# Patient Record
Sex: Female | Born: 1950 | Race: White | Hispanic: No | Marital: Married | Smoking: Never smoker
Health system: Southern US, Community
[De-identification: ages and names within clinical notes are randomized; demographics above are authoritative.]

## PROBLEM LIST (undated history)

## (undated) DIAGNOSIS — E785 Hyperlipidemia, unspecified: Secondary | ICD-10-CM

## (undated) DIAGNOSIS — E1142 Type 2 diabetes mellitus with diabetic polyneuropathy: Secondary | ICD-10-CM

## (undated) DIAGNOSIS — E119 Type 2 diabetes mellitus without complications: Secondary | ICD-10-CM

## (undated) DIAGNOSIS — I10 Essential (primary) hypertension: Secondary | ICD-10-CM

## (undated) HISTORY — PX: SKIN CANCER EXCISION: SHX779

## (undated) HISTORY — DX: Essential (primary) hypertension: I10

## (undated) HISTORY — PX: UTERINE FIBROID SURGERY: SHX826

## (undated) HISTORY — PX: RENAL CYST EXCISION: SUR506

## (undated) HISTORY — PX: LIPOMA EXCISION: SHX5283

## (undated) HISTORY — DX: Type 2 diabetes mellitus without complications: E11.9

## (undated) HISTORY — DX: Hyperlipidemia, unspecified: E78.5

---

## 1999-07-11 ENCOUNTER — Other Ambulatory Visit: Admission: RE | Admit: 1999-07-11 | Discharge: 1999-07-11 | Payer: Self-pay | Admitting: Obstetrics and Gynecology

## 2001-04-29 ENCOUNTER — Other Ambulatory Visit: Admission: RE | Admit: 2001-04-29 | Discharge: 2001-04-29 | Payer: Self-pay | Admitting: Family Medicine

## 2001-08-04 HISTORY — PX: NEPHRECTOMY: SHX65

## 2004-01-19 ENCOUNTER — Ambulatory Visit: Payer: Self-pay | Admitting: Urology

## 2008-02-17 ENCOUNTER — Ambulatory Visit: Payer: Self-pay | Admitting: Urology

## 2012-10-22 DIAGNOSIS — C44321 Squamous cell carcinoma of skin of nose: Secondary | ICD-10-CM | POA: Insufficient documentation

## 2013-01-04 HISTORY — PX: HERNIA REPAIR: SHX51

## 2013-01-10 ENCOUNTER — Inpatient Hospital Stay: Payer: Self-pay | Admitting: Surgery

## 2013-01-10 LAB — COMPREHENSIVE METABOLIC PANEL
Albumin: 3.6 g/dL (ref 3.4–5.0)
Alkaline Phosphatase: 46 U/L
Anion Gap: 12 (ref 7–16)
BUN: 20 mg/dL — ABNORMAL HIGH (ref 7–18)
Bilirubin,Total: 0.6 mg/dL (ref 0.2–1.0)
Calcium, Total: 9.2 mg/dL (ref 8.5–10.1)
Chloride: 97 mmol/L — ABNORMAL LOW (ref 98–107)
Co2: 27 mmol/L (ref 21–32)
Creatinine: 0.89 mg/dL (ref 0.60–1.30)
EGFR (African American): >60
EGFR (Non-African Amer.): >60
Glucose: 143 mg/dL — ABNORMAL HIGH (ref 65–99)
Potassium: 3 mmol/L — ABNORMAL LOW (ref 3.5–5.1)
Sodium: 136 mmol/L (ref 136–145)

## 2013-01-10 LAB — URINALYSIS, COMPLETE
Glucose,UR: NEGATIVE mg/dL (ref 0–75)
RBC,UR: 10 /HPF (ref 0–5)
Squamous Epithelial: 9

## 2013-01-10 LAB — CBC
MCH: 25.4 pg — ABNORMAL LOW (ref 26.0–34.0)
MCHC: 33.8 g/dL (ref 32.0–36.0)
Platelet: 273 10*3/uL (ref 150–440)
RBC: 5.39 10*6/uL — ABNORMAL HIGH (ref 3.80–5.20)
RDW: 15.2 % — ABNORMAL HIGH (ref 11.5–14.5)
WBC: 9.4 10*3/uL (ref 3.6–11.0)

## 2013-01-10 LAB — LIPASE, BLOOD: Lipase: 167 U/L (ref 73–393)

## 2013-01-11 LAB — BASIC METABOLIC PANEL
Anion Gap: 6 — ABNORMAL LOW (ref 7–16)
Co2: 31 mmol/L (ref 21–32)
Creatinine: 0.92 mg/dL (ref 0.60–1.30)
Glucose: 161 mg/dL — ABNORMAL HIGH (ref 65–99)
Osmolality: 274 (ref 275–301)

## 2013-01-11 LAB — CBC WITH DIFFERENTIAL/PLATELET
Eosinophil #: 0.1 10*3/uL (ref 0.0–0.7)
Eosinophil %: 0.7 %
HGB: 12 g/dL (ref 12.0–16.0)
Lymphocyte #: 1.6 10*3/uL (ref 1.0–3.6)
Lymphocyte %: 16.2 %
MCHC: 33.3 g/dL (ref 32.0–36.0)
Monocyte %: 8 %
Neutrophil #: 7.2 10*3/uL — ABNORMAL HIGH (ref 1.4–6.5)
RBC: 4.78 10*6/uL (ref 3.80–5.20)

## 2013-01-12 LAB — PATHOLOGY REPORT

## 2013-07-14 LAB — LIPID PANEL
Cholesterol: 180 mg/dL (ref 0–200)
HDL: 52 mg/dL (ref 35–70)
LDL Cholesterol: 99 mg/dL
Triglycerides: 147 mg/dL (ref 40–160)

## 2013-07-14 LAB — TSH: TSH: 2.45 u[IU]/mL (ref <=5.90)

## 2014-02-16 ENCOUNTER — Ambulatory Visit: Payer: Self-pay | Admitting: Family Medicine

## 2014-02-16 LAB — HM DEXA SCAN

## 2014-03-09 LAB — HEMOGLOBIN A1C: HEMOGLOBIN A1C: 6.7 % — AB (ref 4.0–6.0)

## 2014-03-09 LAB — BASIC METABOLIC PANEL
BUN: 20 mg/dL (ref 4–21)
Creatinine: 1 mg/dL (ref <=1.1)
Glucose: 139 mg/dL
POTASSIUM: 4.6 mmol/L (ref 3.4–5.3)
SODIUM: 142 mmol/L (ref 137–147)

## 2014-03-09 LAB — CBC AND DIFFERENTIAL
HEMATOCRIT: 40 % (ref 36–46)
Hemoglobin: 13.1 g/dL (ref 12.0–16.0)
Platelets: 248 10*3/uL (ref 150–399)
WBC: 5.6 10^3/mL

## 2014-03-09 LAB — HEPATIC FUNCTION PANEL
ALT: 25 U/L (ref 7–35)
AST: 20 U/L (ref 13–35)

## 2014-03-16 ENCOUNTER — Ambulatory Visit: Payer: Self-pay | Admitting: Family Medicine

## 2014-05-27 NOTE — Op Note (Signed)
PATIENT NAME:  Karen Olsen, Karen Olsen MR#:  [id number] DATE OF BIRTH:  [birth year]  DATE OF PROCEDURE:  01/10/2013  PREOPERATIVE DIAGNOSIS: Acutely incarcerated ventral hernia.   POSTOPERATIVE DIAGNOSIS: Acutely incarcerated ventral hernia.   PROCEDURE: Repair of acutely incarcerated recurrent ventral hernia with mesh.   SURGEON: Phoebe Perch, M.D.   ANESTHESIA: General with endotracheal tube.   INDICATIONS: This is Olsen patient who we believe has had Olsen prior umbilical hernia repair and multiple operations through Olsen midline incision, who presents with Olsen likely recurrent periumbilical ventral hernia with incarcerated small bowel and small bowel obstruction. Preoperatively, we discussed rationale for surgery, the options of observation, risks of bleeding, infection, recurrence, mesh placement, mesh infection, mesh removal and bowel injury. This was all reviewed for her in the preop holding area. She understood and agreed to proceed, as did her husband.   FINDINGS: Noncompromised but acutely incarcerated small bowel loop through Olsen very tight small recurrent ventral hernia with long-standing sac.   DESCRIPTION OF PROCEDURE: The patient was induced to general anesthesia. She was given IV antibiotics. VTE prophylaxis was in place. Olsen Foley catheter was in place. Then she was prepped and draped in sterile fashion. Marcaine was infiltrated in the skin and subcutaneous tissues around the periumbilical area and Olsen midline incision was utilized to open and explore Olsen large incarcerated hernia sac on the midline periumbilical. In so doing, clear fluid exuded. The small bowel was found to be acutely incarcerated and could not be reduced. The incision was extended cephalad to allow for extension of the hernia rent to allow for reduction. The bowel was inspected after reduction and it was showing no signs of ischemia, however, there was Olsen small serosal tear which was closed with Lembert-type imbricating 3-0 silk sutures. No  sign of luminal rent was noticed. Again, the bowel was placed back into the abdominal cavity and additional Marcaine was placed for Olsen total of 30 mL. The hernia sac was dissected off of the subcutaneous tissues and trimmed, and Olsen portion of it was sent for examination labeled as hernia sac.   The bowel was then eviscerated again and no sign of ischemia was noted. The serosal tear had been adequately repaired and at this point the bowel was placed back into the abdominal cavity.  The rent was measured and then Olsen 6.4 cm Ventralex patch with PTFE was placed into the abdominal cavity. The extended portion of the hernia rent cephalad was closed with figure-of-eight 0 Prolene incorporating the cephalad extent of the mesh and then multiple U sutures or figure-of-eight sutures of 0 Prolene were placed as well to hold the mesh in place. Adequate repair was noted. The hernia sac was closed over the top of the mesh after the tails were closed, this being done with first 0 Prolenes followed by 2-0 and 3-0 Vicryl in layers, also approximating the skin of the umbilicus back to the fascia. Once multiple layers of Vicryl were utilized to close the dead space in the periumbilical area, skin staples were placed and Olsen sterile dressing was placed. The patient tolerated the procedure well. There were no complications. Sponge, lap and needle counts were correct. The estimated blood loss was 25 mL.   ____________________________ Jerrol Banana. Burt Knack, MD rec:cs D: 01/10/2013 12:27:00 ET T: 01/10/2013 19:41:33 ET JOB#: [id number]  cc: Jerrol Banana. Burt Knack, MD, <Dictator> Florene Glen MD ELECTRONICALLY SIGNED 01/14/2013 12:39

## 2014-05-27 NOTE — H&P (Signed)
Subjective/Chief Complaint abd pain   History of Present Illness three days abd pain, nausea vomiting, cramping. 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:   ALLERGIES:  Ampicillin: Rash  Ceclor: Rash  Seconal: Other  Penicillin: Rash  Family and Social History:  Family History Non-Contributory   Social History negative tobacco, negative ETOH   Place of Living Home   Review of Systems:  Fever/Chills No   Cough No   Abdominal Pain Yes   Diarrhea No   Constipation No   Nausea/Vomiting Yes   SOB/DOE No   Chest Pain No   Dysuria No   Tolerating Diet No  Nauseated  Vomiting   Physical Exam:  GEN obese, very uncomfortable   HEENT pink conjunctivae   NECK supple   RESP normal resp effort  clear BS  no use of accessory muscles   CARD regular rate   ABD positive tenderness  positive hernia  soft  distended  scars, inc recurrent VH in periumbilical area   LYMPH negative neck   EXTR negative edema   SKIN normal to palpation   PSYCH alert, A+O to time, place, person, good insight   Lab Results: Hepatic:  07-Dec-14 07:33   Bilirubin, Total 0.6  Alkaline Phosphatase 46 (45-117 NOTE: New Reference Range 12/25/12)  SGPT (ALT) 20  SGOT (AST)  12  Total Protein, Serum 7.0  Albumin, Serum 3.6  Routine Chem:  07-Dec-14 07:33   Lipase 167 (Result(s) reported on 10 Jan 2013 at 08:13AM.)  Glucose, Serum  143  BUN  20  Creatinine (comp) 0.89  Sodium, Serum 136  Potassium, Serum  3.0  Chloride, Serum  97  CO2, Serum 27  Calcium (Total), Serum 9.2   Osmolality (calc) 277  eGFR (African American) >60  eGFR (Non-African American) >60 (eGFR values <70m/min/1.73 m2 may be an indication of chronic kidney disease (CKD). Calculated eGFR is useful in patients with stable renal function. The eGFR calculation will not be reliable in acutely ill patients when serum creatinine is changing rapidly. It is not useful in  patients on dialysis. The eGFR calculation may not be applicable to patients at the low and high extremes of body sizes, pregnant women, and vegetarians.)  Anion Gap 12  Routine UA:  07-Dec-14 07:33   Color (UA) Yellow  Clarity (UA) Turbid  Glucose (UA) Negative  Bilirubin (UA) Negative  Ketones (UA) 1+  Specific Gravity (UA) 1.030  Blood (UA) 2+  pH (UA) 5.0  Protein (UA) 30 mg/dL  Nitrite (UA) Negative  Leukocyte Esterase (UA) Trace (Result(s) reported on 10 Jan 2013 at 08:20AM.)  RBC (UA) 10 /HPF  WBC (UA) 24 /HPF  Bacteria (UA) TRACE  Epithelial Cells (UA) 9 /HPF (Result(s) reported on 10 Jan 2013 at 08:20AM.)  Routine Hem:  07-Dec-14 07:33   WBC (CBC) 9.4  RBC (CBC)  5.39  Hemoglobin (CBC) 13.7  Hematocrit (CBC) 40.6  Platelet Count (CBC) 273 (Result(s) reported on 10 Jan 2013 at 08:08AM.)  MCV  75  MCH  25.4  MCHC 33.8  RDW  15.2  Radiology Results: CT:    07-Dec-14 08:53, CT Abdomen and Pelvis With Contrast  CT Abdomen and Pelvis With Contrast  REASON FOR EXAM:    (1) lower abd cramping intermittently x 2 weeks,   worse x 2days, umbilical hernia  COMMENTS:       PROCEDURE: CT  - CT ABDOMEN / PELVIS  W  - Jan 10 2013  8:53AM     CLINICAL DATA:  Abdominal cramping.  Nausea.    EXAM:  CT ABDOMEN AND PELVIS WITH CONTRAST    TECHNIQUE:  Multidetector CT imaging of the abdomen and pelvis was performed  using the standard protocol following bolus administration of  intravenous contrast.  CONTRAST:  125 cc of Isovue 370    COMPARISON:  None    FINDINGS:  The lung bases appear clear. No pleural  or pericardial effusion  identified.    The stomach stress. No focal liver abnormalities. Gallbladder is  normal. There is no biliary dilatation. The pancreas is within  normal limits. Normal appearance of the spleen. The right adrenal  gland is normal. Normal appearance of the left adrenal gland.  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 Previous left  nephrectomy.  The urinary bladder appears normal. The uterus appears abnormally  enlarged for a postmenopausal female. There is mixed attenuation  within the distended endometrial cavity, image number 69/series 2.  There is a mass within the left-sided uterus which measures 5.8 cm,  image 70/ series 2.    Normal caliber of the abdominal aorta. There is mild calcified  atherosclerotic change. No upper abdominal adenopathy identified.  There is no pelvic or inguinal adenopathy noted.    The stomach appears within normal limits. The small bowel loops are  abnormally dilated. Thesemeasure up to 3.7 cm and there are small  bowel air-fluid levels. Umbilical hernia is identified which  contains the obstructed loop of small bowel, image 54/series 2. The  appendix is visualized and appears normal. Normal appearance of the  colon. Asmall amount of free fluid is present within the pelvis.    Review of the visualized bony structures is significant for mild  lumbar spondylosis. No aggressive lytic or sclerotic bone lesions  identified.     IMPRESSION:  1. Small bowel obstruction. The point of obstruction is the small  bowel containing umbilical hernia.  2. Free fluid within the pelvis is likely secondary to obstruction.  3. Abnormal appearance of the uterus for a postmenopausal female.  The uterine cavity is distended in contains mixed attenuation  material. Cannot rule out endometrial neoplasm. Clinical correlation  and pelvic sonogram is advised.  Electronically Signed    By: Kerby Moors M.D.    On: 01/10/2013 09:04          Verified By: Angelita Ingles, M.D.,    Assessment/Admission Diagnosis acutely incarcerated rec VH with bowel present. No sign of ischemia but needs urgent exploration options rationale and risks rev'd see dictation   Electronic Signatures: Florene Glen (MD)  (Signed 07-Dec-14 10:37)  Authored: CHIEF COMPLAINT and HISTORY, PAST MEDICAL/SURGIAL HISTORY, ALLERGIES, FAMILY AND SOCIAL HISTORY, REVIEW OF SYSTEMS, PHYSICAL EXAM, LABS, Radiology, ASSESSMENT AND PLAN   Last Updated: 07-Dec-14 10:37 by Florene Glen (MD)

## 2014-05-27 NOTE — H&P (Signed)
PATIENT NAME:  Karen Olsen, Karen Olsen MR#:  [id number] DATE OF BIRTH:  07-Jan-1951  DATE OF ADMISSION:  01/10/2013  CHIEF COMPLAINT: Abdominal pain.   HISTORY OF PRESENT ILLNESS: This is Olsen patient with 3 days of abdominal pain that started Thursday night. She had had an episode like this before back in November that resolved, but this one has been unrelenting. She has cramping abdominal pain and points to her periumbilical area where there is an obvious ventral hernia present which is incarcerated. She describes nausea, multiple emeses, although she has not had an emesis since yesterday and is passing gas. She has not had Olsen bowel movement since yesterday and her pain has not improved. Denies fevers or chills.   PAST MEDICAL HISTORY: Diabetes, hypertension. Denies coronary artery disease, myocardial infarction or stroke or lung disease.   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   ALLERGIES: MULTIPLE INCLUDING AMPICILLIN, CECLOR, PENICILLIN AND SECONAL.   MEDICATIONS: Multiple, see chart.   FAMILY HISTORY: Noncontributory.   SOCIAL HISTORY: The patient does not smoke or drink.   REVIEW OF SYSTEMS: Olsen 10 system review has been performed and negative with the exception of that mentioned in the history of present illness.   PHYSICAL EXAMINATION: GENERAL: Very uncomfortable appearing female patient, somewhat writhing on the ER bed, holding her abdomen.  VITAL SIGNS: Temperature of 97.9, pulse 77, respirations 20, blood pressure 151/89, 98% room air sat, marked as Olsen pain scale of 2.  HEENT: No scleral icterus.  NECK: No palpable neck nodes.  CHEST: Clear to auscultation.  CARDIAC: Regular rate and rhythm.  ABDOMEN: Distended, tympanitic. There is an infraumbilical midline scar, somewhat complex in nature, with an incarcerated periumbilical, possibly recurrent ventral hernia. It is very  tender, but there is no overlying erythema, no skin erosions, but it is not reducible and is quite tender. No guarding or rebound present.  EXTREMITIES: Without edema. Calves are nontender.  NEUROLOGIC: Grossly intact.  INTEGUMENT: No jaundice.   CT scan is personally reviewed showing an incarcerated loop of small bowel within Olsen ventral hernia on the midline. Hemogram is within normal limits. Electrolytes show Olsen potassium slightly depressed at 3.0 and Olsen BUN of 20, otherwise normal.   ASSESSMENT AND PLAN: This is Olsen patient with an incarcerated periumbilical, recurrent ventral hernia. There is Olsen question of Olsen possible umbilical hernia repair in the past bringing up the question of recurrence. She has an acutely incarcerated piece of small bowel within this and requires urgent exploration. I discussed with her the plans for urgent exploration, the options of observation, and the risks of bleeding, infection, recurrence, mesh placement, mesh infection and recurrence and the need for removal of infected mesh, and also the potential for not placing mesh with its higher risk of recurrence due to the presence of compromised bowel. The potential for Olsen bowel resection was discussed with its inherent risks of bleeding, infection and anastomotic leak. This was reviewed for she and her husband. They understood and agreed to proceed with urgent exploration and repair of an incarcerated recurrent ventral hernia.    ____________________________ Jerrol Banana. Burt Knack, MD rec:aw D: 01/10/2013 10:42:06 ET T: 01/10/2013 11:25:19 ET JOB#: [id number]  cc: Jerrol Banana. Burt Knack, MD, <Dictator> Florene Glen MD ELECTRONICALLY SIGNED 01/10/2013 12:57

## 2014-06-21 DIAGNOSIS — I152 Hypertension secondary to endocrine disorders: Secondary | ICD-10-CM | POA: Insufficient documentation

## 2014-06-21 DIAGNOSIS — D219 Benign neoplasm of connective and other soft tissue, unspecified: Secondary | ICD-10-CM | POA: Insufficient documentation

## 2014-06-21 DIAGNOSIS — R319 Hematuria, unspecified: Secondary | ICD-10-CM | POA: Insufficient documentation

## 2014-06-21 DIAGNOSIS — I10 Essential (primary) hypertension: Secondary | ICD-10-CM | POA: Insufficient documentation

## 2014-06-21 DIAGNOSIS — D3000 Benign neoplasm of unspecified kidney: Secondary | ICD-10-CM | POA: Insufficient documentation

## 2014-06-21 DIAGNOSIS — E1159 Type 2 diabetes mellitus with other circulatory complications: Secondary | ICD-10-CM | POA: Insufficient documentation

## 2014-06-21 DIAGNOSIS — O24419 Gestational diabetes mellitus in pregnancy, unspecified control: Secondary | ICD-10-CM | POA: Insufficient documentation

## 2014-06-21 DIAGNOSIS — E559 Vitamin D deficiency, unspecified: Secondary | ICD-10-CM | POA: Insufficient documentation

## 2014-06-21 DIAGNOSIS — E119 Type 2 diabetes mellitus without complications: Secondary | ICD-10-CM | POA: Insufficient documentation

## 2014-06-21 DIAGNOSIS — D229 Melanocytic nevi, unspecified: Secondary | ICD-10-CM | POA: Insufficient documentation

## 2014-06-21 DIAGNOSIS — R202 Paresthesia of skin: Secondary | ICD-10-CM | POA: Insufficient documentation

## 2014-06-21 DIAGNOSIS — E785 Hyperlipidemia, unspecified: Secondary | ICD-10-CM

## 2014-06-21 DIAGNOSIS — E1169 Type 2 diabetes mellitus with other specified complication: Secondary | ICD-10-CM | POA: Insufficient documentation

## 2014-08-31 ENCOUNTER — Ambulatory Visit (INDEPENDENT_AMBULATORY_CARE_PROVIDER_SITE_OTHER): Payer: PPO | Admitting: Family Medicine

## 2014-08-31 ENCOUNTER — Encounter: Payer: Self-pay | Admitting: Family Medicine

## 2014-08-31 VITALS — BP 162/96 | HR 68 | Temp 98.6°F | Resp 16 | Ht 62.0 in | Wt 189.0 lb

## 2014-08-31 DIAGNOSIS — E119 Type 2 diabetes mellitus without complications: Secondary | ICD-10-CM | POA: Diagnosis not present

## 2014-08-31 DIAGNOSIS — I10 Essential (primary) hypertension: Secondary | ICD-10-CM | POA: Diagnosis not present

## 2014-08-31 DIAGNOSIS — E7800 Pure hypercholesterolemia, unspecified: Secondary | ICD-10-CM

## 2014-08-31 DIAGNOSIS — D1803 Hemangioma of intra-abdominal structures: Secondary | ICD-10-CM | POA: Diagnosis not present

## 2014-08-31 LAB — POCT GLYCOSYLATED HEMOGLOBIN (HGB A1C)
Est. average glucose Bld gHb Est-mCnc: 140
HEMOGLOBIN A1C: 6.5

## 2014-08-31 NOTE — Progress Notes (Signed)
Subjective:    Patient ID: Karen Olsen, female    DOB: [birth year], 64 y.o.   MRN: [id number]  Diabetes She presents for her follow-up diabetic visit. She has type 2 (Last A1C on 03/09/2014 was 6.7%) diabetes mellitus. Hypoglycemia symptoms include headaches and sweats. Associated symptoms include fatigue (due to hot weather) and foot paresthesias. Pertinent negatives for diabetes include no blurred vision, no chest pain, no foot ulcerations, no polydipsia, no polyphagia, no polyuria, no visual change, no weakness and no weight loss. Current diabetic treatment includes oral agent (monotherapy) (Metformin 1000 mg). She is compliant with treatment all of the time. She participates in exercise weekly (has recently decreased exercise due to heat). There is no change in her home blood glucose trend. She does not see a podiatrist.Eye exam is current.  Hypertension This is a chronic problem. Associated symptoms include headaches, malaise/fatigue (due to weather) and sweats. Pertinent negatives include no anxiety, blurred vision, chest pain, neck pain, orthopnea, palpitations, peripheral edema or shortness of breath. Treatments tried: Lisinopril-HCTZ 20-12.5 mg.  Hyperlipidemia This is a chronic problem. Lipid results: 07/14/2013 Total- 180; triglycerides- 147; HDL- 52; LDL- 99. Associated symptoms include a focal sensory loss (foot paresthesias) and leg pain (cramps at night). Pertinent negatives include no chest pain, focal weakness, myalgias or shortness of breath. Current antihyperlipidemic treatment includes statins (Atorvastatin 10 mg).      Review of Systems  Constitutional: Positive for malaise/fatigue (due to weather) and fatigue (due to hot weather). Negative for weight loss.  Eyes: Negative for blurred vision.  Respiratory: Negative for shortness of breath.   Cardiovascular: Negative for chest pain, palpitations and orthopnea.  Endocrine: Negative for polydipsia, polyphagia and polyuria.   Musculoskeletal: Negative for myalgias and neck pain.  Neurological: Positive for headaches. Negative for focal weakness and weakness.   BP 162/96 mmHg  Pulse 68  Temp(Src) 98.6 F (37 C) (Oral)  Resp 16  Ht 5\' 2"  (1.575 m)  Wt 189 lb (85.73 kg)  BMI 34.56 kg/m2   Patient Active Problem List   Diagnosis Date Noted  . Benign neoplasm of kidney 06/21/2014  . Diabetes mellitus, type 2 06/21/2014  . Essential (primary) hypertension 06/21/2014  . Diabetes mellitus arising in pregnancy 06/21/2014  . Blood in the urine 06/21/2014  . Hypercholesteremia 06/21/2014  . Burning or prickling sensation 06/21/2014  . Benign melanoma 06/21/2014  . Fibroid 06/21/2014  . Avitaminosis D 06/21/2014  . Squamous cell carcinoma of nose 10/22/2012   No past medical history on file. Current Outpatient Prescriptions on File Prior to Visit  Medication Sig  . ASPIRIN ADULT LOW STRENGTH PO Take by mouth.  Marland Kitchen atorvastatin (LIPITOR) 10 MG tablet Take by mouth.  . cyanocobalamin 100 MCG tablet Take by mouth.  Marland Kitchen glucose blood test strip RELION CONFIRM/MICRO TEST (In Vitro Strip)  1 (one) Strip Strip daily for 0 days  Quantity: 100;  Refills: 3   Ordered :29-July-2012  [name] ;  Started 29-July-2012 Active  . lisinopril-hydrochlorothiazide (PRINZIDE,ZESTORETIC) 20-12.5 MG per tablet Take by mouth.  . metFORMIN (GLUCOPHAGE) 1000 MG tablet Take by mouth.  . MULTIPLE VITAMIN PO Take by mouth.  . Omega-3 Fatty Acids (FISH OIL) 1000 MG CAPS Take by mouth.   No current facility-administered medications on file prior to visit.   Allergies  Allergen Reactions  . Phosphate Laxative  [Sodium Phosphate]     Phosphosoda- Bad stomach cramps  . Ceclor  [Cefaclor] Rash  . Penicillins Rash   Past Surgical History  Procedure Laterality Date  . Hernia repair  01/2013    Dr. Burt Knack  . 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 Renal cyst excision Right   . Uterine fibroid surgery    .  Skin cancer excision    . Lipoma excision      Removed from back.   History   Social History  . Marital Status: Married    Spouse Name: N/A  . Number of Children: N/A  . Years of Education: N/A   Occupational History  . Not on file.   Social History Main Topics  . Smoking status: Never Smoker   . Smokeless tobacco: Never Used  . Alcohol Use: No  . Drug Use: No  . Sexual Activity: Not on file   Other Topics Concern  . Not on file   Social History Narrative   Family History  Problem Relation Age of Onset  . Stroke Mother   . Prostate cancer Father   . Congestive Heart Failure Father   . Stroke Maternal Grandmother   . Stroke Maternal Grandfather   . Alzheimer's disease Maternal Grandfather   . Cancer Paternal Grandmother   . Cancer Paternal Grandfather        Objective:   Physical Exam  Constitutional: She is oriented to person, place, and time. She appears well-developed and well-nourished.  Neurological: She is alert and oriented to person, place, and time.  Psychiatric: She has a normal mood and affect. Her behavior is normal. Judgment and thought content normal.   BP 162/96 mmHg  Pulse 68  Temp(Src) 98.6 F (37 C) (Oral)  Resp 16  Ht 5\' 2"  (1.575 m)  Wt 189 lb (85.73 kg)  BMI 34.56 kg/m2      Assessment & Plan:  1. Essential (primary) hypertension Elevated today. Did not take medication today.   Also had salt last night. Will eat healthy, take medication and monitor.  Will increase medication if needed.   2. Type 2 diabetes mellitus without complication Improved. Continue to work on lifestyle.  Continue current medication. Recheck in 3 months.  - POCT glycosylated hemoglobin (Hb A1C) Results for orders placed or performed in visit on 08/31/14  POCT glycosylated hemoglobin (Hb A1C)  Result Value Ref Range   Hemoglobin A1C 6.5    Est. average glucose Bld gHb Est-mCnc 140    v 3. Hypercholesteremia Stable.   4. Hemangioma of liver Will repeat  ultrasound for stability.   - US Abdomen Limited RUQ; Future  Margarita Rana, MD

## 2014-09-07 ENCOUNTER — Other Ambulatory Visit: Payer: Self-pay | Admitting: Family Medicine

## 2014-09-07 DIAGNOSIS — E7800 Pure hypercholesterolemia, unspecified: Secondary | ICD-10-CM

## 2014-09-07 DIAGNOSIS — I10 Essential (primary) hypertension: Secondary | ICD-10-CM

## 2014-09-21 ENCOUNTER — Other Ambulatory Visit: Payer: Self-pay | Admitting: Family Medicine

## 2014-09-21 ENCOUNTER — Ambulatory Visit
Admission: RE | Admit: 2014-09-21 | Discharge: 2014-09-21 | Disposition: A | Discharge status: Home / Self Care | Payer: PPO | Source: Ambulatory Visit | Attending: Family Medicine | Admitting: Family Medicine

## 2014-09-21 DIAGNOSIS — D1803 Hemangioma of intra-abdominal structures: Secondary | ICD-10-CM | POA: Diagnosis not present

## 2014-09-21 DIAGNOSIS — E119 Type 2 diabetes mellitus without complications: Secondary | ICD-10-CM

## 2014-09-21 NOTE — Telephone Encounter (Signed)
08/31/14 A1c 6.5

## 2014-11-16 ENCOUNTER — Telehealth: Payer: Self-pay

## 2014-11-16 NOTE — Telephone Encounter (Signed)
Pt called because her BP has been elevated recently; SBP has been as high as the 180's, and DBP has been as high as the 100's. Pt reports she is having a very slight headache located bilaterally behind her ears. When asked to rate the H/A on a pain scale, pt reports it is a 0/10, "it's just a tightness that is there". Pt denies confusion, slurred speech, facial asymmetry. Pt is concerned because her mother died from a CVA. Advised pt to go to ED if any stroke or MI sx appear. Appointment scheduled for tomorrow. Asked pt to bring in BP cuff to ensure calibration. Med list reviewed with pt. Renaldo Fiddler, CMA

## 2014-11-17 ENCOUNTER — Ambulatory Visit (INDEPENDENT_AMBULATORY_CARE_PROVIDER_SITE_OTHER): Payer: PPO | Admitting: Family Medicine

## 2014-11-17 ENCOUNTER — Encounter: Payer: Self-pay | Admitting: Family Medicine

## 2014-11-17 VITALS — BP 184/94 | HR 68 | Temp 99.2°F | Resp 16 | Wt 189.0 lb

## 2014-11-17 DIAGNOSIS — G4489 Other headache syndrome: Secondary | ICD-10-CM

## 2014-11-17 DIAGNOSIS — R519 Headache, unspecified: Secondary | ICD-10-CM | POA: Insufficient documentation

## 2014-11-17 DIAGNOSIS — I10 Essential (primary) hypertension: Secondary | ICD-10-CM

## 2014-11-17 DIAGNOSIS — R51 Headache: Secondary | ICD-10-CM

## 2014-11-17 MED ORDER — AMLODIPINE BESYLATE 2.5 MG PO TABS: 2.5000 mg | ORAL_TABLET | Freq: Every day | ORAL | Status: DC

## 2014-11-17 NOTE — Progress Notes (Signed)
Patient ID: Karen Olsen, female   DOB: [birth year], 64 y.o.   MRN: [id number]          [name] Female    DOB: 08/18/1950   64 y.o.   MRN: [id number] Visit Date: 11/17/2014  Today's Provider: Margarita Rana, MD   Chief Complaint  Patient presents with  . Hypertension   Subjective:    Hypertension This is a chronic problem. The problem has been waxing and waning since onset. The problem is uncontrolled (Pt states her bp's have been running 150-180's/ 90/100's ). Associated symptoms include headaches. Pertinent negatives include no anxiety, blurred vision, chest pain, malaise/fatigue, neck pain, orthopnea, palpitations, peripheral edema, PND, shortness of breath or sweats. Risk factors for coronary artery disease include dyslipidemia and diabetes mellitus. Past treatments include ACE inhibitors and diuretics. There are no compliance problems.  There is no history of angina, kidney disease, CVA, heart failure, PVD or retinopathy.       Allergies  Allergen Reactions  . Phosphate Laxative  [Sodium Phosphate]     Phosphosoda- Bad stomach cramps  . Ceclor  [Cefaclor] Rash  . Penicillins Rash   Previous Medications   ASPIRIN ADULT LOW STRENGTH PO    Take by mouth.   ATORVASTATIN (LIPITOR) 10 MG TABLET    TAKE ONE TABLET EVERY EVENING   GLUCOSE BLOOD TEST STRIP    RELION CONFIRM/MICRO TEST (In Vitro Strip)  1 (one) Strip Strip daily for 0 days  Quantity: 100;  Refills: 3   Ordered :29-July-2012  [name] ;  Started 29-July-2012 Active   LISINOPRIL-HYDROCHLOROTHIAZIDE (PRINZIDE,ZESTORETIC) 20-12.5 MG PER TABLET    TAKE TWO TABLETS DAILY USUALLY IN THE MORNING   METFORMIN (GLUCOPHAGE) 1000 MG TABLET    TAKE ONE TABLET TWICE A DAY   MULTIPLE VITAMIN PO    Take by mouth.   OMEGA-3 FATTY ACIDS (FISH OIL) 1000 MG CAPS    Take by mouth.   VITAMIN B-12 (CYANOCOBALAMIN) 1000 MCG TABLET    Take 1,000 mcg by mouth daily.     Review of Systems  Constitutional: Negative.   Negative for malaise/fatigue.  Eyes: Negative for blurred vision.  Respiratory: Positive for cough (Secondary to allergies.). Negative for apnea, choking, chest tightness, shortness of breath and stridor.   Cardiovascular: Negative.  Negative for chest pain, palpitations, orthopnea and PND.  Gastrointestinal: Negative.   Musculoskeletal: Negative for neck pain.  Neurological: Positive for dizziness (Only happened twice.) and headaches. Negative for light-headedness.    Social History  Substance Use Topics  . Smoking status: Never Smoker   . Smokeless tobacco: Never Used  . Alcohol Use: No   Objective:   BP 184/94 mmHg  Pulse 68  Temp(Src) 99.2 F (37.3 C) (Oral)  Resp 16  Wt 189 lb (85.73 kg)  Physical Exam  Constitutional: She is oriented to person, place, and time. She appears well-developed and well-nourished.  Cardiovascular: Normal rate and regular rhythm.   Pulmonary/Chest: Effort normal and breath sounds normal.  Neurological: She is alert and oriented to person, place, and time. No cranial nerve deficit. Coordination normal.  Skin: Skin is warm.  Psychiatric: She has a normal mood and affect. Her behavior is normal. Judgment and thought content normal.      Assessment & Plan:      1. Essential (primary) hypertension Not well controlled.  Will add amlodipine and monitor at home. Call if not improving. Husband is having a lot of medical issues right now.  - amLODipine (  NORVASC) 2.5 MG tablet; Take 1 tablet (2.5 mg total) by mouth daily.  Dispense: 90 tablet; Refill: 3  2. Other headache syndrome New. Suspect related to blood pressure.  Will work up further if does not go down.  Exam normal today.       Margarita Rana, MD  [facility]

## 2014-11-21 ENCOUNTER — Telehealth: Payer: Self-pay | Admitting: Family Medicine

## 2014-11-21 NOTE — Telephone Encounter (Signed)
Pt states she was in on Thursday for high blood pressure.  Pt states her blood pressure reading over the weekend was 180/100.   Pt states she also has a headache.  Pt is  asking if she needs to double up on her medication?  CB#[phone]/MW

## 2014-11-21 NOTE — Telephone Encounter (Signed)
Pt advised as directed below.   Thanks,

## 2014-11-21 NOTE — Telephone Encounter (Signed)
Yes.  Increase to 5 mg and continue to monitor. Thanks.

## 2014-11-30 ENCOUNTER — Ambulatory Visit (INDEPENDENT_AMBULATORY_CARE_PROVIDER_SITE_OTHER): Payer: PPO | Admitting: Family Medicine

## 2014-11-30 ENCOUNTER — Encounter: Payer: Self-pay | Admitting: Family Medicine

## 2014-11-30 VITALS — BP 156/84 | HR 64 | Temp 98.3°F | Resp 12 | Wt 187.0 lb

## 2014-11-30 DIAGNOSIS — E7800 Pure hypercholesterolemia, unspecified: Secondary | ICD-10-CM | POA: Diagnosis not present

## 2014-11-30 DIAGNOSIS — E119 Type 2 diabetes mellitus without complications: Secondary | ICD-10-CM

## 2014-11-30 DIAGNOSIS — Z23 Encounter for immunization: Secondary | ICD-10-CM | POA: Diagnosis not present

## 2014-11-30 DIAGNOSIS — I10 Essential (primary) hypertension: Secondary | ICD-10-CM | POA: Diagnosis not present

## 2014-11-30 LAB — POCT GLYCOSYLATED HEMOGLOBIN (HGB A1C): Hemoglobin A1C: 6.6

## 2014-11-30 MED ORDER — AMLODIPINE BESYLATE 10 MG PO TABS: 10.0000 mg | ORAL_TABLET | Freq: Every day | ORAL | Status: DC

## 2014-11-30 NOTE — Progress Notes (Signed)
Patient ID: Karen Olsen, female   DOB: May 08, 1950, 64 y.o.   MRN: [id number]     Visit Date: 11/30/2014  Today's Provider: Margarita Rana, MD   Chief Complaint  Patient presents with  . Hypertension  . Diabetes   Subjective:   HPI    Diabetes Mellitus Type II, Follow-up:   Lab Results  Component Value Date   HGBA1C 6.6 11/30/2014   HGBA1C 6.5 08/31/2014   HGBA1C 6.7* 03/09/2014    Last seen for diabetes 3 months ago.  Management since then includes none. She reports good compliance with treatment. She is not having side effects.   Current symptoms include neuropathy. Home blood sugar records:  Fasting 150s-180s, some 130s-depending on what she is eating.  Episodes of hypoglycemia? no   Current Insulin Regimen: none Most Recent Eye Exam: December 2015.  Scheduled for follow up in 2016. Has a spot that they are following.  Weight trend: stable Prior visit with dietician: yes - years ago when she had gestational diabetes when she was pregnant. She tries to eat a good healthy meals, some greens but not a lot.  Pertinent Labs:    Component Value Date/Time   CHOL 180 07/14/2013   TRIG 147 07/14/2013   CREATININE 1.0 03/09/2014   CREATININE 0.92 01/11/2013 0425    Wt Readings from Last 3 Encounters:  11/30/14 187 lb (84.823 kg)  11/17/14 189 lb (85.73 kg)  08/31/14 189 lb (85.73 kg)      Hypertension, follow-up:  BP Readings from Last 3 Encounters:  11/30/14 156/84  11/17/14 184/94  08/31/14 162/96    She was last seen for hypertension 2 weeks ago.  BP at that visit was 184/94.  Management since that visit includes adding Amlodipine 2.5 mg but on October 17th she called back due to B/P was still high and she was advised to increase Amlodipine to taking 2 tablets daily. She has been checking her B/P and she states it seems to run higher in the afternoon and evenings, per her B/P machine average B/P around 140s/90s.  70s in the am at diastolic.   She  checks her B/P 5 to 6 times daily. She reports good compliance with treatment. She is not having side effects.   No swelling.   She is exercising.walking regularly.     Weight trend- Stable  Wt Readings from Last 3 Encounters:  11/30/14 187 lb (84.823 kg)  11/17/14 189 lb (85.73 kg)  08/31/14 189 lb (85.73 kg)           Allergies  Allergen Reactions  . Phosphate Laxative  [Sodium Phosphate]     Phosphosoda- Bad stomach cramps  . Ceclor  [Cefaclor] Rash  . Penicillins Rash   Previous Medications   ASPIRIN ADULT LOW STRENGTH PO    Take by mouth.   ATORVASTATIN (LIPITOR) 10 MG TABLET    TAKE ONE TABLET EVERY EVENING   GLUCOSE BLOOD TEST STRIP    RELION CONFIRM/MICRO TEST (In Vitro Strip)  1 (one) Strip Strip daily for 0 days  Quantity: 100;  Refills: 3   Ordered :29-July-2012  [name] ;  Started 29-July-2012 Active   LISINOPRIL-HYDROCHLOROTHIAZIDE (PRINZIDE,ZESTORETIC) 20-12.5 MG PER TABLET    TAKE TWO TABLETS DAILY USUALLY IN THE MORNING   METFORMIN (GLUCOPHAGE) 1000 MG TABLET    TAKE ONE TABLET TWICE A DAY   MULTIPLE VITAMIN PO    Take by mouth.   OMEGA-3 FATTY ACIDS (FISH OIL) 1000 MG CAPS  Take by mouth.   VITAMIN B-12 (CYANOCOBALAMIN) 1000 MCG TABLET    Take 1,000 mcg by mouth daily.    Review of Systems  Constitutional: Negative.   Respiratory: Negative.   Cardiovascular: Negative.   Musculoskeletal: Positive for myalgias (after walking sometimes her legs hurt), back pain and arthralgias. Negative for joint swelling and gait problem.  Neurological: Positive for numbness.    Social History  Substance Use Topics  . Smoking status: Never Smoker   . Smokeless tobacco: Never Used  . Alcohol Use: No   Objective:   BP 156/84 mmHg  Pulse 64  Temp(Src) 98.3 F (36.8 C)  Resp 12  Wt 187 lb (84.823 kg)  Physical Exam  Constitutional: She is oriented to person, place, and time. She appears well-developed and well-nourished.  Cardiovascular: Normal rate  and regular rhythm.   Pulmonary/Chest: Effort normal and breath sounds normal.  Neurological: She is alert and oriented to person, place, and time.  Psychiatric: She has a normal mood and affect. Her behavior is normal. Judgment and thought content normal.      Assessment & Plan:    1. Essential (primary) hypertension Improved, but not quite at goal. Will increase Amlodipine to 10 mg and recheck at follow up.  Will call with results Will follow.  Take only once or twice a day.   - amLODipine (NORVASC) 10 MG tablet; Take 1 tablet (10 mg total) by mouth daily.  Dispense: 90 tablet; Refill: 3  2. Type 2 diabetes mellitus without complication, unspecified long term insulin use status (HCC) Stable.  Will continue to work on lifestyle. Keep her medication as is.   Recheck in 3 months.   - POCT HgB A1C Results for orders placed or performed in visit on 11/30/14  POCT HgB A1C  Result Value Ref Range   Hemoglobin A1C 6.6     3. Need for influenza vaccination Given today.   - Flu vaccine HIGH DOSE PF (Fluzone High dose)  4. Hypercholesteremia Check labs including CMET and CBC. Will start taking cholesterol medication with evening medication.   - Lipid panel  Margarita Rana, MD  [facility]

## 2014-12-01 ENCOUNTER — Telehealth: Payer: Self-pay

## 2014-12-01 LAB — CBC WITH DIFFERENTIAL/PLATELET
BASOS ABS: 0 10*3/uL (ref 0.0–0.2)
Basos: 1 %
EOS (ABSOLUTE): 0.1 10*3/uL (ref 0.0–0.4)
Eos: 2 %
HEMOGLOBIN: 13.4 g/dL (ref 11.1–15.9)
Hematocrit: 41.1 % (ref 34.0–46.6)
IMMATURE GRANS (ABS): 0 10*3/uL (ref 0.0–0.1)
Immature Granulocytes: 0 %
LYMPHS ABS: 2.3 10*3/uL (ref 0.7–3.1)
Lymphs: 30 %
MCH: 24.8 pg — AB (ref 26.6–33.0)
MCHC: 32.6 g/dL (ref 31.5–35.7)
MCV: 76 fL — ABNORMAL LOW (ref 79–97)
MONOS ABS: 0.4 10*3/uL (ref 0.1–0.9)
Monocytes: 5 %
NEUTROS PCT: 62 %
Neutrophils Absolute: 4.7 10*3/uL (ref 1.4–7.0)
PLATELETS: 261 10*3/uL (ref 150–379)
RBC: 5.41 x10E6/uL — AB (ref 3.77–5.28)
RDW: 14.7 % (ref 12.3–15.4)
WBC: 7.5 10*3/uL (ref 3.4–10.8)

## 2014-12-01 LAB — LIPID PANEL
Chol/HDL Ratio: 3.4 ratio units (ref 0.0–4.4)
Cholesterol, Total: 161 mg/dL (ref 100–199)
HDL: 47 mg/dL (ref >39)
LDL CALC: 71 mg/dL (ref 0–99)
Triglycerides: 214 mg/dL — ABNORMAL HIGH (ref 0–149)
VLDL CHOLESTEROL CAL: 43 mg/dL — AB (ref 5–40)

## 2014-12-01 LAB — COMPREHENSIVE METABOLIC PANEL
ALBUMIN: 4.7 g/dL (ref 3.6–4.8)
ALT: 28 IU/L (ref 0–32)
AST: 20 IU/L (ref 0–40)
Albumin/Globulin Ratio: 2 (ref 1.1–2.5)
Alkaline Phosphatase: 50 IU/L (ref 39–117)
BILIRUBIN TOTAL: 0.4 mg/dL (ref 0.0–1.2)
BUN / CREAT RATIO: 16 (ref 11–26)
BUN: 15 mg/dL (ref 8–27)
CALCIUM: 10.2 mg/dL (ref 8.7–10.3)
CHLORIDE: 97 mmol/L (ref 97–106)
CO2: 26 mmol/L (ref 18–29)
CREATININE: 0.91 mg/dL (ref 0.57–1.00)
GFR calc non Af Amer: 66 mL/min/{1.73_m2} (ref >59)
GFR, EST AFRICAN AMERICAN: 76 mL/min/{1.73_m2} (ref >59)
GLUCOSE: 135 mg/dL — AB (ref 65–99)
Globulin, Total: 2.4 g/dL (ref 1.5–4.5)
Potassium: 4 mmol/L (ref 3.5–5.2)
Sodium: 142 mmol/L (ref 136–144)
TOTAL PROTEIN: 7.1 g/dL (ref 6.0–8.5)

## 2014-12-01 NOTE — Telephone Encounter (Signed)
-----   Message from Margarita Rana, MD sent at 12/01/2014  6:38 AM EDT ----- Labs stable. Please notify patient. Thanks.

## 2014-12-01 NOTE — Telephone Encounter (Signed)
Pt advised.   Thanks,   -Zuleika Gallus  

## 2015-02-22 ENCOUNTER — Encounter: Payer: Self-pay | Admitting: Family Medicine

## 2015-02-22 ENCOUNTER — Ambulatory Visit (INDEPENDENT_AMBULATORY_CARE_PROVIDER_SITE_OTHER): Payer: PPO | Admitting: Family Medicine

## 2015-02-22 VITALS — BP 140/82 | HR 72 | Temp 98.2°F | Resp 16 | Wt 190.0 lb

## 2015-02-22 DIAGNOSIS — I10 Essential (primary) hypertension: Secondary | ICD-10-CM | POA: Diagnosis not present

## 2015-02-22 DIAGNOSIS — E119 Type 2 diabetes mellitus without complications: Secondary | ICD-10-CM

## 2015-02-22 DIAGNOSIS — R252 Cramp and spasm: Secondary | ICD-10-CM | POA: Diagnosis not present

## 2015-02-22 LAB — POCT GLYCOSYLATED HEMOGLOBIN (HGB A1C)
Est. average glucose Bld gHb Est-mCnc: 160
Hemoglobin A1C: 7.2

## 2015-02-22 NOTE — Progress Notes (Signed)
Subjective:    Patient ID: Karen Olsen, female    DOB: [birth year], 65 y.o.   MRN: [id number]  Diabetes She presents for her follow-up (Last A1C 11/30/2014 and was 6.6%) diabetic visit. She has type 2 diabetes mellitus. Her disease course has been stable. Hypoglycemia symptoms include mood changes and tremors. Pertinent negatives for hypoglycemia include no headaches or sweats. (Rarely) Associated symptoms include foot paresthesias. Pertinent negatives for diabetes include no blurred vision, no chest pain, no fatigue, no foot ulcerations, no polydipsia, no polyphagia, no polyuria, no visual change, no weakness and no weight loss. Risk factors for coronary artery disease include diabetes mellitus, dyslipidemia, hypertension, obesity and post-menopausal. Current diabetic treatment includes oral agent (monotherapy) (Metformin 1000 mg BID). She is compliant with treatment all of the time. She is following a generally healthy diet. She participates in exercise daily (pt walks 30 minutes at a time until pt's feet start burning). There is no change in her home blood glucose trend. An ACE inhibitor/angiotensin II receptor blocker is being taken. Eye exam is current (Has appointment on February 3rd at [facility]).  Hypertension This is a chronic problem. Episode onset: FU from 11/30/2014. Increased Amlodipine to 10 mg po qd at LOV. The problem is unchanged. The problem is uncontrolled (Home BP readings are 140-160/80-95 ). Associated symptoms include palpitations (on rare occasion; long-standing problem and is unchanged per pt). Pertinent negatives include no anxiety, blurred vision, chest pain, headaches, malaise/fatigue, neck pain, orthopnea, peripheral edema, shortness of breath or sweats. Treatments tried: Amlodipine 10 mg, Lisinopril-HCTZ 20-12.5 mg 2 tablets po qd. There are no compliance problems.       Review of Systems  Constitutional: Negative for weight loss, malaise/fatigue and fatigue.  Eyes:  Negative for blurred vision.  Respiratory: Negative for shortness of breath.   Cardiovascular: Positive for palpitations (on rare occasion; long-standing problem and is unchanged per pt). Negative for chest pain and orthopnea.  Endocrine: Negative for polydipsia, polyphagia and polyuria.  Musculoskeletal: Negative for neck pain.  Neurological: Positive for tremors. Negative for weakness and headaches.   BP 140/82 mmHg  Pulse 72  Temp(Src) 98.2 F (36.8 C) (Oral)  Resp 16  Wt 190 lb (86.183 kg)   Patient Active Problem List   Diagnosis Date Noted  . Headache 11/17/2014  . Hemangioma of liver 08/31/2014  . Benign neoplasm of kidney 06/21/2014  . Diabetes mellitus, type 2 ([place]) 06/21/2014  . Essential (primary) hypertension 06/21/2014  . Blood in the urine 06/21/2014  . Hypercholesteremia 06/21/2014  . Burning or prickling sensation 06/21/2014  . Benign melanoma 06/21/2014  . Fibroid 06/21/2014  . Avitaminosis D 06/21/2014  . 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 Current Outpatient Prescriptions on File Prior to Visit  Medication Sig  . amLODipine (NORVASC) 10 MG tablet Take 1 tablet (10 mg total) by mouth daily.  . ASPIRIN ADULT LOW STRENGTH PO Take by mouth.  Marland Kitchen atorvastatin (LIPITOR) 10 MG tablet TAKE ONE TABLET EVERY EVENING  . glucose blood test strip RELION CONFIRM/MICRO TEST (In Vitro Strip)  1 (one) Strip Strip daily for 0 days  Quantity: 100;  Refills: 3   Ordered :29-July-2012  [name] ;  Started 29-July-2012 Active  . lisinopril-hydrochlorothiazide (PRINZIDE,ZESTORETIC) 20-12.5 MG per tablet TAKE TWO TABLETS DAILY USUALLY IN THE MORNING  . metFORMIN (GLUCOPHAGE) 1000 MG tablet TAKE ONE TABLET TWICE A DAY  . MULTIPLE VITAMIN PO Take by mouth.  . vitamin B-12 (  CYANOCOBALAMIN) 1000 MCG tablet Take 1,000 mcg by mouth daily.  . Omega-3 Fatty Acids (FISH OIL) 1000 MG CAPS Take by mouth. Reported on 02/22/2015   No current  facility-administered medications on file prior to visit.   Allergies  Allergen Reactions  . Phosphate Laxative  [Sodium Phosphate]     Phosphosoda- Bad stomach cramps  . Ceclor  [Cefaclor] Rash  . Penicillins Rash   Past Surgical History  Procedure Laterality Date  . Hernia repair  01/2013    Dr. Burt Knack  . 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 Renal cyst excision Right   . Uterine fibroid surgery    . Skin cancer excision    . Lipoma excision      Removed from back.   Social History   Social History  . Marital Status: Married    Spouse Name: N/A  . Number of Children: N/A  . Years of Education: N/A   Occupational History  . Not on file.   Social History Main Topics  . Smoking status: Never Smoker   . Smokeless tobacco: Never Used  . Alcohol Use: No  . Drug Use: No  . Sexual Activity: Not on file   Other Topics Concern  . Not on file   Social History Narrative   Family History  Problem Relation Age of Onset  . Stroke Mother   . Prostate cancer Father   . Congestive Heart Failure Father   . Stroke Maternal Grandmother   . Stroke Maternal Grandfather   . Alzheimer's disease Maternal Grandfather   . Cancer Paternal Grandmother   . Cancer Paternal Grandfather       Objective:   Physical Exam  Constitutional: She appears well-developed and well-nourished.  Cardiovascular: Normal rate and regular rhythm.   Pulmonary/Chest: Effort normal and breath sounds normal.  Psychiatric: She has a normal mood and affect. Her behavior is normal.      Assessment & Plan:  1. Type 2 diabetes mellitus without complication, without long-term current use of insulin ([place]) Worsening due to holidays. Pt agrees to work on lifestyle changes. Recheck in 3 months. If BS have not improved at that time, will consider adding Januvia.  - POCT glycosylated hemoglobin (Hb A1C) Results for orders placed or performed in visit on 02/22/15  POCT glycosylated  hemoglobin (Hb A1C)  Result Value Ref Range   Hemoglobin A1C 7.2    Est. average glucose Bld gHb Est-mCnc 160     2. Essential (primary) hypertension Not to goal. Will FU in 3 months. Pt will work on diet and exercise. If BP has not improved at FU, will consider adding Spironolactone.  3. Muscle cramps Worsening. Will check labs as below. FU pending results. - Comprehensive metabolic panel - Magnesium   Patient seen and examined by Jerrell Belfast, MD, and note scribed by Renaldo Fiddler, CMA.   I have reviewed the document for accuracy and completeness and I agree with above. Jerrell Belfast, MD

## 2015-02-23 ENCOUNTER — Other Ambulatory Visit: Payer: Self-pay | Admitting: Family Medicine

## 2015-02-23 ENCOUNTER — Telehealth: Payer: Self-pay

## 2015-02-23 DIAGNOSIS — E119 Type 2 diabetes mellitus without complications: Secondary | ICD-10-CM

## 2015-02-23 LAB — COMPREHENSIVE METABOLIC PANEL
ALBUMIN: 4.5 g/dL (ref 3.6–4.8)
ALK PHOS: 52 IU/L (ref 39–117)
ALT: 23 IU/L (ref 0–32)
AST: 18 IU/L (ref 0–40)
Albumin/Globulin Ratio: 1.9 (ref 1.1–2.5)
BILIRUBIN TOTAL: 0.4 mg/dL (ref 0.0–1.2)
BUN / CREAT RATIO: 22 (ref 11–26)
BUN: 20 mg/dL (ref 8–27)
CHLORIDE: 98 mmol/L (ref 96–106)
CO2: 27 mmol/L (ref 18–29)
CREATININE: 0.9 mg/dL (ref 0.57–1.00)
Calcium: 10 mg/dL (ref 8.7–10.3)
GFR calc Af Amer: 77 mL/min/{1.73_m2} (ref >59)
GFR calc non Af Amer: 67 mL/min/{1.73_m2} (ref >59)
GLUCOSE: 144 mg/dL — AB (ref 65–99)
Globulin, Total: 2.4 g/dL (ref 1.5–4.5)
Potassium: 4 mmol/L (ref 3.5–5.2)
Sodium: 142 mmol/L (ref 134–144)
Total Protein: 6.9 g/dL (ref 6.0–8.5)

## 2015-02-23 LAB — MAGNESIUM: MAGNESIUM: 1.6 mg/dL (ref 1.6–2.3)

## 2015-02-23 MED ORDER — METFORMIN HCL 1000 MG PO TABS: 1000.0000 mg | ORAL_TABLET | Freq: Two times a day (BID) | ORAL | Status: DC

## 2015-02-23 NOTE — Telephone Encounter (Signed)
-----   Message from Margarita Rana, MD sent at 02/23/2015  6:34 AM EST ----- Labs stable. Please notify patient. Thanks.

## 2015-02-23 NOTE — Telephone Encounter (Signed)
Pt advised.   Thanks,   -Diamond Jentz  

## 2015-03-01 ENCOUNTER — Ambulatory Visit: Payer: PPO | Admitting: Family Medicine

## 2015-03-10 DIAGNOSIS — E119 Type 2 diabetes mellitus without complications: Secondary | ICD-10-CM | POA: Diagnosis not present

## 2015-03-10 LAB — HM DIABETES EYE EXAM

## 2015-05-24 ENCOUNTER — Encounter: Payer: Self-pay | Admitting: Family Medicine

## 2015-05-24 ENCOUNTER — Ambulatory Visit (INDEPENDENT_AMBULATORY_CARE_PROVIDER_SITE_OTHER): Payer: PPO | Admitting: Family Medicine

## 2015-05-24 VITALS — BP 142/70 | HR 68 | Temp 98.9°F | Resp 16 | Ht 62.0 in | Wt 185.0 lb

## 2015-05-24 DIAGNOSIS — I10 Essential (primary) hypertension: Secondary | ICD-10-CM | POA: Diagnosis not present

## 2015-05-24 DIAGNOSIS — M6530 Trigger finger, unspecified finger: Secondary | ICD-10-CM

## 2015-05-24 DIAGNOSIS — E119 Type 2 diabetes mellitus without complications: Secondary | ICD-10-CM | POA: Diagnosis not present

## 2015-05-24 DIAGNOSIS — D1803 Hemangioma of intra-abdominal structures: Secondary | ICD-10-CM | POA: Diagnosis not present

## 2015-05-24 LAB — POCT GLYCOSYLATED HEMOGLOBIN (HGB A1C)
ESTIMATED AVERAGE GLUCOSE: 140
Hemoglobin A1C: 6.5

## 2015-05-24 NOTE — Progress Notes (Signed)
Patient ID: Karen Olsen, female   DOB: 1950/03/22, 65 y.o.   MRN: [id number]       [name] Female    DOB: 11/19/1950   65 y.o.   MRN: [id number] Visit Date: 05/24/2015  Today's Provider: Margarita Rana, MD   Chief Complaint  Patient presents with  . Diabetes  . Hypertension   Subjective:    Diabetes She presents for her follow-up diabetic visit. She has type 2 diabetes mellitus. Her disease course has been stable. There are no hypoglycemic associated symptoms. Pertinent negatives for hypoglycemia include no headaches. Pertinent negatives for diabetes include no blurred vision, no chest pain, no fatigue, no foot paresthesias, no polydipsia, no polyphagia, no polyuria and no weakness. There are no hypoglycemic complications. Her weight is decreasing steadily (patient has lost 5lbs since last OV). She is following a generally healthy diet. She does not see a podiatrist.Eye exam is current.  Hypertension This is a chronic problem. The problem is unchanged. Pertinent negatives include no blurred vision, chest pain, headaches, palpitations or shortness of breath. There are no compliance problems.   Patient reports that she checks her BP at home and it ranges anywhere between 150-130s/70-80s.   Also has a finger that locks up.  Needs recheck of liver hemangioma also.      Allergies  Allergen Reactions  . Phosphate Laxative  [Sodium Phosphate]     Phosphosoda- Bad stomach cramps  . Ceclor  [Cefaclor] Rash  . Penicillins Rash   Previous Medications   AMLODIPINE (NORVASC) 10 MG TABLET    Take 1 tablet (10 mg total) by mouth daily.   ASPIRIN ADULT LOW STRENGTH PO    Take by mouth.   ATORVASTATIN (LIPITOR) 10 MG TABLET    TAKE ONE TABLET EVERY EVENING   GLUCOSE BLOOD TEST STRIP    RELION CONFIRM/MICRO TEST (In Vitro Strip)  1 (one) Strip Strip daily for 0 days  Quantity: 100;  Refills: 3   Ordered :29-July-2012  [name] ;  Started 29-July-2012 Active   LISINOPRIL-HYDROCHLOROTHIAZIDE (PRINZIDE,ZESTORETIC) 20-12.5 MG PER TABLET    TAKE TWO TABLETS DAILY USUALLY IN THE MORNING   METFORMIN (GLUCOPHAGE) 1000 MG TABLET    Take 1 tablet (1,000 mg total) by mouth 2 (two) times daily.   MULTIPLE VITAMIN PO    Take by mouth.   OMEGA-3 FATTY ACIDS (FISH OIL) 1000 MG CAPS    Take by mouth. Reported on 02/22/2015   VITAMIN B-12 (CYANOCOBALAMIN) 1000 MCG TABLET    Take 1,000 mcg by mouth daily.    Review of Systems  Constitutional: Negative.  Negative for fatigue.  Eyes: Negative for blurred vision.  Respiratory: Negative for shortness of breath.   Cardiovascular: Negative for chest pain and palpitations.  Endocrine: Negative for polydipsia, polyphagia and polyuria.  Neurological: Negative for weakness and headaches.    Social History  Substance Use Topics  . Smoking status: Never Smoker   . Smokeless tobacco: Never Used  . Alcohol Use: No   Objective:   BP 142/70 mmHg  Pulse 68  Temp(Src) 98.9 F (37.2 C)  Resp 16  Ht 5\' 2"  (1.575 m)  Wt 185 lb (83.915 kg)  BMI 33.83 kg/m2  SpO2 97%  Physical Exam  Constitutional: She is oriented to person, place, and time. She appears well-developed and well-nourished.  Cardiovascular: Normal rate, regular rhythm and normal heart sounds.   Pulmonary/Chest: Breath sounds normal.  Neurological: She is alert and oriented to person, place, and time.  Psychiatric: She has a normal mood and affect. Her behavior is normal. Judgment and thought content normal.      Assessment & Plan:     1. Essential (primary) hypertension Stable. Continue to monitor.   2. Type 2 diabetes mellitus without complication, without long-term current use of insulin (HCC) Improved. Patient has lost 5lbs since last OV. Continue diet and exercise.  - POCT glycosylated hemoglobin (Hb A1C)  3. Trigger finger New Problem. Refer to Ortho.  - Ambulatory referral to Orthopedic Surgery  4. Hemangioma of liver Patient is due for 6  month F/U US of liver lesion. F/U pending results.  - US Abdomen Limited RUQ; Future    Patient was seen and examined by Jerrell Belfast, MD, and scribed by Wilburt Finlay, [place].  I have reviewed the document for accuracy and completeness and I agree with above. - Jerrell Belfast, MD   Margarita Rana, MD  [facility]

## 2015-05-25 ENCOUNTER — Telehealth: Payer: Self-pay | Admitting: Family Medicine

## 2015-05-25 DIAGNOSIS — D1803 Hemangioma of intra-abdominal structures: Secondary | ICD-10-CM

## 2015-05-25 NOTE — Telephone Encounter (Signed)
Per [facility] order for ultrasound needs to be changed to [id number].Thanks

## 2015-05-25 NOTE — Telephone Encounter (Signed)
Put in new order. Thanks.

## 2015-05-31 ENCOUNTER — Ambulatory Visit: Payer: PPO

## 2015-06-07 ENCOUNTER — Ambulatory Visit
Admission: RE | Admit: 2015-06-07 | Discharge: 2015-06-07 | Disposition: A | Discharge status: Home / Self Care | Payer: PPO | Source: Ambulatory Visit | Attending: Family Medicine | Admitting: Family Medicine

## 2015-06-07 DIAGNOSIS — D1803 Hemangioma of intra-abdominal structures: Secondary | ICD-10-CM | POA: Diagnosis not present

## 2015-06-07 DIAGNOSIS — K7689 Other specified diseases of liver: Secondary | ICD-10-CM | POA: Diagnosis not present

## 2015-06-08 ENCOUNTER — Telehealth: Payer: Self-pay | Admitting: Family Medicine

## 2015-06-08 ENCOUNTER — Telehealth: Payer: Self-pay

## 2015-06-08 DIAGNOSIS — D1803 Hemangioma of intra-abdominal structures: Secondary | ICD-10-CM

## 2015-06-08 NOTE — Telephone Encounter (Signed)
Reordered.  Thanks.

## 2015-06-08 NOTE — Telephone Encounter (Signed)
Per [facility] MRI of liver should be w/wo contrast.Thanks

## 2015-06-08 NOTE — Telephone Encounter (Signed)
Put in order. Do you have to schedule this Judson Roch.  Forgot to add Wednesday early am if possible. Thanks.

## 2015-06-08 NOTE — Telephone Encounter (Signed)
-----   Message from Margarita Rana, MD sent at 06/07/2015 10:22 AM EDT ----- Slight increase in size of lesion. Recommend MRI to further determine what it is.   Difficult to see what is in light of fatty liver.  Please notify patient and schedule scan.  Thanks.

## 2015-06-08 NOTE — Telephone Encounter (Signed)
Pt advised.  She reports Wednesdays are the best day; or very early in the AM.  Which MRI do I order?    Thanks,   -Mickel Baas

## 2015-06-14 ENCOUNTER — Other Ambulatory Visit: Payer: Self-pay | Admitting: Family Medicine

## 2015-06-14 DIAGNOSIS — E7800 Pure hypercholesterolemia, unspecified: Secondary | ICD-10-CM

## 2015-06-20 DIAGNOSIS — M65341 Trigger finger, right ring finger: Secondary | ICD-10-CM | POA: Diagnosis not present

## 2015-06-26 ENCOUNTER — Other Ambulatory Visit: Payer: Self-pay | Admitting: Family Medicine

## 2015-06-26 DIAGNOSIS — I10 Essential (primary) hypertension: Secondary | ICD-10-CM

## 2015-06-28 ENCOUNTER — Ambulatory Visit
Admission: RE | Admit: 2015-06-28 | Discharge: 2015-06-28 | Disposition: A | Discharge status: Home / Self Care | Payer: PPO | Source: Ambulatory Visit | Attending: Family Medicine | Admitting: Family Medicine

## 2015-06-28 DIAGNOSIS — D1803 Hemangioma of intra-abdominal structures: Secondary | ICD-10-CM

## 2015-06-28 DIAGNOSIS — N281 Cyst of kidney, acquired: Secondary | ICD-10-CM | POA: Diagnosis not present

## 2015-06-28 DIAGNOSIS — K760 Fatty (change of) liver, not elsewhere classified: Secondary | ICD-10-CM | POA: Insufficient documentation

## 2015-06-28 DIAGNOSIS — Z905 Acquired absence of kidney: Secondary | ICD-10-CM | POA: Insufficient documentation

## 2015-06-28 DIAGNOSIS — K7689 Other specified diseases of liver: Secondary | ICD-10-CM | POA: Diagnosis not present

## 2015-06-28 LAB — POCT I-STAT CREATININE: Creatinine, Ser: 1 mg/dL (ref 0.44–1.00)

## 2015-06-28 MED ORDER — GADOBENATE DIMEGLUMINE 529 MG/ML IV SOLN
20.0000 mL | Freq: Once | INTRAVENOUS | Status: AC | PRN
  Administered 2015-06-28: 17 mL via INTRAVENOUS

## 2015-06-28 NOTE — Progress Notes (Signed)
Advised  ED 

## 2015-07-21 DIAGNOSIS — M65341 Trigger finger, right ring finger: Secondary | ICD-10-CM | POA: Diagnosis not present

## 2015-08-23 ENCOUNTER — Encounter: Payer: Self-pay | Admitting: Physician Assistant

## 2015-08-23 ENCOUNTER — Ambulatory Visit (INDEPENDENT_AMBULATORY_CARE_PROVIDER_SITE_OTHER): Payer: PPO | Admitting: Physician Assistant

## 2015-08-23 VITALS — BP 140/80 | HR 72 | Temp 98.1°F | Resp 16 | Wt 189.6 lb

## 2015-08-23 DIAGNOSIS — R252 Cramp and spasm: Secondary | ICD-10-CM

## 2015-08-23 DIAGNOSIS — E119 Type 2 diabetes mellitus without complications: Secondary | ICD-10-CM | POA: Diagnosis not present

## 2015-08-23 LAB — POCT GLYCOSYLATED HEMOGLOBIN (HGB A1C)
Est. average glucose Bld gHb Est-mCnc: 151
HEMOGLOBIN A1C: 6.9

## 2015-08-23 MED ORDER — COQ10 200 MG PO CAPS: 1.0000 | ORAL_CAPSULE | Freq: Every day | ORAL | Status: AC

## 2015-08-23 NOTE — Progress Notes (Signed)
[name] Female    DOB: [birth year]   65 y.o.   MRN: [id number] Visit Date: 08/23/2015  Today's Provider: Mar Daring, PA-C   Chief Complaint  Patient presents with  . Follow-up    Diabetes   Subjective:    HPI  Diabetes Mellitus Type II, Follow-up:   Lab Results  Component Value Date   HGBA1C 6.9 08/23/2015   HGBA1C 6.5 05/24/2015   HGBA1C 7.2 02/22/2015    Last seen for diabetes 3 months ago.  Management since then includes None. She reports excellent compliance with treatment. She is not having side effects.  Current symptoms include none and have been stable. Home blood sugar records: 100's-120 after eating it goes up in 170's. Patient reports that May and in June her levels were really high 140's-150's. Had trigger finger with steroid injections that caused her blood sugars to be up during that time. States it took about 3 weeks to get back to her normal readings.   Episodes of hypoglycemia? no   Most Recent Eye Exam: UTD Weight trend: fluctuating a bit Current diet: in general, a "healthy" diet   Current exercise: walking  Pertinent Labs:    Component Value Date/Time   CHOL 161 11/30/2014 0945   CHOL 180 07/14/2013   TRIG 214* 11/30/2014 0945   HDL 47 11/30/2014 0945   HDL 52 07/14/2013   LDLCALC 71 11/30/2014 0945   LDLCALC 99 07/14/2013   CREATININE 1.00 06/28/2015 0921   CREATININE 1.0 03/09/2014   CREATININE 0.92 01/11/2013 0425    Wt Readings from Last 3 Encounters:  08/23/15 189 lb 9.6 oz (86.002 kg)  05/24/15 185 lb (83.915 kg)  02/22/15 190 lb (86.183 kg)    ------------------------------------------------------------------------     Allergies  Allergen Reactions  . Phosphate Laxative  [Sodium Phosphate]     Phosphosoda- Bad stomach cramps  . Ceclor  [Cefaclor] Rash  . Penicillins Rash   Current Meds  Medication Sig  . amLODipine (NORVASC) 10 MG tablet Take 1 tablet (10 mg total) by mouth daily.  .  ASPIRIN ADULT LOW STRENGTH PO Take by mouth.  Marland Kitchen atorvastatin (LIPITOR) 10 MG tablet TAKE ONE TABLET EVERY EVENING  . glucose blood test strip RELION CONFIRM/MICRO TEST (In Vitro Strip)  1 (one) Strip Strip daily for 0 days  Quantity: 100;  Refills: 3   Ordered :29-July-2012  [name] ;  Started 29-July-2012 Active  . lisinopril-hydrochlorothiazide (PRINZIDE,ZESTORETIC) 20-12.5 MG tablet TAKE TWO TABLETS EVERY MORNING  . metFORMIN (GLUCOPHAGE) 1000 MG tablet Take 1 tablet (1,000 mg total) by mouth 2 (two) times daily.  . MULTIPLE VITAMIN PO Take by mouth.  . Omega-3 Fatty Acids (FISH OIL) 1000 MG CAPS Take by mouth. Reported on 02/22/2015  . vitamin B-12 (CYANOCOBALAMIN) 1000 MCG tablet Take 1,000 mcg by mouth daily.    Review of Systems  Constitutional: Negative for fatigue.  Respiratory: Negative for cough, chest tightness and shortness of breath.   Cardiovascular: Negative for chest pain, palpitations and leg swelling.  Gastrointestinal: Negative for nausea, vomiting and abdominal pain.  Neurological: Negative for dizziness, weakness, light-headedness, numbness and headaches.    Social History  Substance Use Topics  . Smoking status: Never Smoker   . Smokeless tobacco: Never Used  . Alcohol Use: No   Objective:   BP 140/80 mmHg  Pulse 72  Temp(Src) 98.1 F (36.7 C) (Oral)  Resp 16  Wt 189 lb 9.6 oz (86.002 kg)  Physical Exam  Constitutional: She appears well-developed and well-nourished. No distress.  Neck: Normal range of motion. Neck supple. No JVD present. No tracheal deviation present. No thyromegaly present.  Cardiovascular: Normal rate, regular rhythm and normal heart sounds.  Exam reveals no gallop and no friction rub.   No murmur heard. Pulmonary/Chest: Effort normal and breath sounds normal. No respiratory distress. She has no wheezes. She has no rales.  Musculoskeletal: She exhibits no edema.  Lymphadenopathy:    She has no cervical adenopathy.  Skin: She is  not diaphoretic.  Vitals reviewed.     Assessment & Plan:     1. Type 2 diabetes mellitus without complication, without long-term current use of insulin (HCC) HgBA1c has increased slightly from 6.5 to 6.9. Discussed making sure to avoid simple carbohydrates and sugars. She states she knew her HgBA1c was going to be a little higher because she has been eating ice cream. Otherwise she tries to eat healthy, eats a lot of fresh vegetables this time of year from garden. Continue to work on lifestyle modification and we will recheck in 3 months with labs for CPE.  - POCT glycosylated hemoglobin (Hb A1C)  2. Muscle cramps Most likely secondary to atorvastatin. States she even did a self initiated trial off of it and did notice an improvement. She did start it back up though. Discussed adding CoQ10 to see if this may help the symptoms. Will recheck in 3 months when she returns for CPE. - Coenzyme Q10 (COQ10) 200 MG CAPS; Take 1 capsule by mouth daily.  Dispense: 30 capsule; Refill: 0       Mar Daring, PA-C  [facility]

## 2015-08-23 NOTE — Patient Instructions (Signed)
Diabetes and Exercise Exercising regularly is important. It is not just about losing weight. It has many health benefits, such as:  Improving your overall fitness, flexibility, and endurance.  Increasing your bone density.  Helping with weight control.  Decreasing your body fat.  Increasing your muscle strength.  Reducing stress and tension.  Improving your overall health. People with diabetes who exercise gain additional benefits because exercise:  Reduces appetite.  Improves the body's use of blood sugar (glucose).  Helps lower or control blood glucose.  Decreases blood pressure.  Helps control blood lipids (such as cholesterol and triglycerides).  Improves the body's use of the hormone insulin by:  Increasing the body's insulin sensitivity.  Reducing the body's insulin needs.  Decreases the risk for heart disease because exercising:  Lowers cholesterol and triglycerides levels.  Increases the levels of good cholesterol (such as high-density lipoproteins [HDL]) in the body.  Lowers blood glucose levels. YOUR ACTIVITY PLAN  Choose an activity that you enjoy, and set realistic goals. To exercise safely, you should begin practicing any new physical activity slowly, and gradually increase the intensity of the exercise over time. Your health care provider or diabetes educator can help create an activity plan that works for you. General recommendations include:  Encouraging children to engage in at least 60 minutes of physical activity each day.  Stretching and performing strength training exercises, such as yoga or weight lifting, at least 2 times per week.  Performing a total of at least 150 minutes of moderate-intensity exercise each week, such as brisk walking or water aerobics.  Exercising at least 3 days per week, making sure you allow no more than 2 consecutive days to pass without exercising.  Avoiding long periods of inactivity (90 minutes or more). When you  have to spend an extended period of time sitting down, take frequent breaks to walk or stretch. RECOMMENDATIONS FOR EXERCISING WITH TYPE 1 OR TYPE 2 DIABETES   Check your blood glucose before exercising. If blood glucose levels are greater than 240 mg/dL, check for urine ketones. Do not exercise if ketones are present.  Avoid injecting insulin into areas of the body that are going to be exercised. For example, avoid injecting insulin into:  The arms when playing tennis.  The legs when jogging.  Keep a record of:  Food intake before and after you exercise.  Expected peak times of insulin action.  Blood glucose levels before and after you exercise.  The type and amount of exercise you have done.  Review your records with your health care provider. Your health care provider will help you to develop guidelines for adjusting food intake and insulin amounts before and after exercising.  If you take insulin or oral hypoglycemic agents, watch for signs and symptoms of hypoglycemia. They include:  Dizziness.  Shaking.  Sweating.  Chills.  Confusion.  Drink plenty of water while you exercise to prevent dehydration or heat stroke. Body water is lost during exercise and must be replaced.  Talk to your health care provider before starting an exercise program to make sure it is safe for you. Remember, almost any type of activity is better than none.   This information is not intended to replace advice given to you by your health care provider. Make sure you discuss any questions you have with your health care provider.   Document Released: 04/13/2003 Document Revised: 06/07/2014 Document Reviewed: 06/30/2012 Elsevier Interactive Patient Education 2016 Elsevier Inc.  

## 2015-09-18 DIAGNOSIS — L710 Perioral dermatitis: Secondary | ICD-10-CM | POA: Diagnosis not present

## 2015-09-18 DIAGNOSIS — K130 Diseases of lips: Secondary | ICD-10-CM | POA: Diagnosis not present

## 2015-09-18 DIAGNOSIS — M71342 Other bursal cyst, left hand: Secondary | ICD-10-CM | POA: Diagnosis not present

## 2015-09-21 ENCOUNTER — Other Ambulatory Visit: Payer: Self-pay | Admitting: Family Medicine

## 2015-09-21 DIAGNOSIS — E7800 Pure hypercholesterolemia, unspecified: Secondary | ICD-10-CM

## 2015-09-26 ENCOUNTER — Other Ambulatory Visit: Payer: Self-pay

## 2015-09-26 DIAGNOSIS — E119 Type 2 diabetes mellitus without complications: Secondary | ICD-10-CM

## 2015-09-26 MED ORDER — METFORMIN HCL 1000 MG PO TABS: 1000.0000 mg | ORAL_TABLET | Freq: Two times a day (BID) | ORAL | 5 refills | Status: DC

## 2015-11-29 ENCOUNTER — Encounter: Payer: Self-pay | Admitting: Physician Assistant

## 2015-11-29 ENCOUNTER — Ambulatory Visit (INDEPENDENT_AMBULATORY_CARE_PROVIDER_SITE_OTHER): Payer: PPO | Admitting: Physician Assistant

## 2015-11-29 VITALS — BP 152/80 | HR 89 | Temp 98.9°F | Resp 16 | Ht 62.0 in | Wt 194.4 lb

## 2015-11-29 DIAGNOSIS — I10 Essential (primary) hypertension: Secondary | ICD-10-CM

## 2015-11-29 DIAGNOSIS — E7800 Pure hypercholesterolemia, unspecified: Secondary | ICD-10-CM | POA: Diagnosis not present

## 2015-11-29 DIAGNOSIS — D1803 Hemangioma of intra-abdominal structures: Secondary | ICD-10-CM | POA: Diagnosis not present

## 2015-11-29 DIAGNOSIS — E1142 Type 2 diabetes mellitus with diabetic polyneuropathy: Secondary | ICD-10-CM

## 2015-11-29 DIAGNOSIS — Z0000 Encounter for general adult medical examination without abnormal findings: Secondary | ICD-10-CM

## 2015-11-29 DIAGNOSIS — D3001 Benign neoplasm of right kidney: Secondary | ICD-10-CM

## 2015-11-29 DIAGNOSIS — E1140 Type 2 diabetes mellitus with diabetic neuropathy, unspecified: Secondary | ICD-10-CM | POA: Insufficient documentation

## 2015-11-29 DIAGNOSIS — Z23 Encounter for immunization: Secondary | ICD-10-CM

## 2015-11-29 NOTE — Progress Notes (Signed)
[name], Female    DOB: Aug 14, 1950, 65 y.o.   MRN: [id number] Visit Date: 11/29/2015  Today's Provider: Mar Daring, PA-C   Chief Complaint  Patient presents with  . Medicare Wellness   Subjective:    Annual wellness visit Karen Olsen is a 65 y.o. female. She feels well. She reports exercising. She reports she is sleeping well.  AWE:01/05/2014 BMD:02/16/2014-Osteopenia EKG:01/05/2014 -----------------------------------------------------------   Review of Systems  Constitutional: Positive for fatigue.  HENT: Negative.   Eyes: Negative.   Respiratory: Negative.   Cardiovascular: Negative.   Gastrointestinal: Negative.   Endocrine: Negative.   Genitourinary: Negative.   Musculoskeletal: Negative.   Skin: Negative.   Allergic/Immunologic: Negative.   Neurological: Negative.   Hematological: Negative.   Psychiatric/Behavioral: Negative.     Social History   Social History  . Marital status: Married    Spouse name: N/A  . Number of children: N/A  . Years of education: N/A   Occupational History  . Not on file.   Social History Main Topics  . Smoking status: Never Smoker  . Smokeless tobacco: Never Used  . Alcohol use No  . Drug use: No  . Sexual activity: Not on file   Other Topics Concern  . Not on file   Social History Narrative  . No narrative on file    History reviewed. No pertinent past medical history.   Patient Active Problem List   Diagnosis Date Noted  . Headache 11/17/2014  . Hemangioma of liver 08/31/2014  . Benign neoplasm of kidney 06/21/2014  . Diabetes mellitus, type 2 ([place]) 06/21/2014  . Essential (primary) hypertension 06/21/2014  . Blood in the urine 06/21/2014  . Hypercholesteremia 06/21/2014  . Burning or prickling sensation 06/21/2014  . Benign melanoma 06/21/2014  . Fibroid 06/21/2014  . Avitaminosis D 06/21/2014  . 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 HERNIA REPAIR  01/2013   Dr. Burt Knack  . LIPOMA EXCISION     Removed from back.  . 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 RENAL CYST EXCISION Right   . SKIN CANCER EXCISION    . UTERINE FIBROID SURGERY      Her family history includes Alzheimer's disease in her maternal grandfather; Cancer in her paternal grandfather and paternal grandmother; Congestive Heart Failure in her father; Prostate cancer in her father; Stroke in her maternal grandfather, maternal grandmother, and mother.    Current Meds  Medication Sig  . amLODipine (NORVASC) 10 MG tablet Take 1 tablet (10 mg total) by mouth daily.  . ASPIRIN ADULT LOW STRENGTH PO Take by mouth.  Marland Kitchen atorvastatin (LIPITOR) 10 MG tablet TAKE ONE TABLET EVERY EVENING  . Coenzyme Q10 (COQ10) 200 MG CAPS Take 1 capsule by mouth daily.  Marland Kitchen glucose blood test strip RELION CONFIRM/MICRO TEST (In Vitro Strip)  1 (one) Strip Strip daily for 0 days  Quantity: 100;  Refills: 3   Ordered :29-July-2012  [name] ;  Started 29-July-2012 Active  . lisinopril-hydrochlorothiazide (PRINZIDE,ZESTORETIC) 20-12.5 MG tablet TAKE TWO TABLETS EVERY MORNING  . metFORMIN (GLUCOPHAGE) 1000 MG tablet Take 1 tablet (1,000 mg total) by mouth 2 (two) times daily.  . MULTIPLE VITAMIN PO Take by mouth.  . Omega-3 Fatty Acids (FISH OIL) 1000 MG CAPS Take by mouth. Reported on 02/22/2015  . vitamin B-12 (CYANOCOBALAMIN) 1000 MCG tablet Take 1,000 mcg by  mouth daily.    Patient Care Team: Mar Daring, PA-C as PCP - General (Family Medicine)    Objective:   Vitals: BP (!) 152/80 (BP Location: Right Arm, Patient Position: Sitting, Cuff Size: Normal)   Pulse 89   Temp 98.9 F (37.2 C) (Oral)   Resp 16   Ht 5\' 2"  (1.575 m)   Wt 194 lb 6.4 oz (88.2 kg)   BMI 35.56 kg/m   Physical Exam  Constitutional: She is oriented to person, place, and time. She appears well-developed and well-nourished. No distress.   HENT:  Head: Normocephalic and atraumatic.  Right Ear: Hearing, tympanic membrane, external ear and ear canal normal.  Left Ear: Hearing, tympanic membrane, external ear and ear canal normal.  Nose: Nose normal.  Mouth/Throat: Uvula is midline, oropharynx is clear and moist and mucous membranes are normal. No oropharyngeal exudate.  Eyes: Conjunctivae and EOM are normal. Pupils are equal, round, and reactive to light. Right eye exhibits no discharge. Left eye exhibits no discharge. No scleral icterus.  Neck: Normal range of motion. Neck supple. No JVD present. Carotid bruit is not present. No tracheal deviation present. No thyromegaly present.  Cardiovascular: Normal rate, regular rhythm, normal heart sounds and intact distal pulses.  Exam reveals no gallop and no friction rub.   No murmur heard. Pulmonary/Chest: Effort normal and breath sounds normal. No respiratory distress. She has no wheezes. She has no rales. She exhibits no tenderness.  Abdominal: Soft. Bowel sounds are normal. She exhibits no distension and no mass. There is no tenderness. There is no rebound and no guarding.  Musculoskeletal: Normal range of motion. She exhibits no edema or tenderness.  Lymphadenopathy:    She has no cervical adenopathy.  Neurological: She is alert and oriented to person, place, and time.  Skin: Skin is warm and dry. No rash noted. She is not diaphoretic.  Psychiatric: She has a normal mood and affect. Her behavior is normal. Judgment and thought content normal.  Vitals reviewed.   Activities of Daily Living In your present state of health, do you have any difficulty performing the following activities: 11/29/2015 11/30/2014  Hearing? N N  Vision? N N  Difficulty concentrating or making decisions? N N  Walking or climbing stairs? N N  Dressing or bathing? N N  Doing errands, shopping? N -  Some recent data might be hidden    Fall Risk Assessment Fall Risk  11/29/2015 08/31/2014  Falls in  the past year? No No     Depression Screen PHQ 2/9 Scores 11/29/2015 08/31/2014  PHQ - 2 Score 0 0    Cognitive Testing - 6-CIT  Correct? Score   What year is it? yes 0 0 or 4  What month is it? yes 0 0 or 3  Memorize:    Pia Mau,  40,  [place],      What time is it? (within 1 hour) yes 0 0 or 3  Count backwards from 20 yes 0 0, 2, or 4  Name the months of the year yes 0 0, 2, or 4  Repeat name & address above yes 0 0, 2, 4, 6, 8, or 10       TOTAL SCORE  0/28   Interpretation:  Normal  Normal (0-7) Abnormal (8-28)   Audit-C Alcohol Use Screening  Question Answer Points  How often do you have alcoholic drink? 1 times monthly or less 1  On days you do drink alcohol,  how many drinks do you typically consume? 0 0  How oftey will you drink 6 or more in a total? never 0  Total Score:  0   A score of 3 or more in women, and 4 or more in men indicates increased risk for alcohol abuse, EXCEPT if all of the points are from question 1.     Assessment & Plan:     Annual Wellness Visit  Reviewed patient's Family Medical History Reviewed and updated list of patient's medical providers Assessment of cognitive impairment was done Assessed patient's functional ability Established a written schedule for health screening [place] Completed and Reviewed  Exercise Activities and Dietary recommendations Goals    None      Immunization History  Administered Date(s) Administered  . Influenza, High Dose Seasonal PF 11/30/2014  . Pneumococcal Conjugate-13 01/05/2014  . Pneumococcal Polysaccharide-23 12/01/2004  . Td 12/26/1995, 12/18/2011  . Tdap 12/18/2011    Health Maintenance  Topic Date Due  . Hepatitis C Screening  [birth year]  . FOOT EXAM  11/12/1958  . MAMMOGRAM  11/12/1998  . COLONOSCOPY  11/12/1998  . ZOSTAVAX  11/11/2008  . PNA vac Low Risk Adult (2 of 2 - PPSV23) 01/06/2015  . INFLUENZA VACCINE  09/05/2015  . HEMOGLOBIN A1C   02/23/2016  . OPHTHALMOLOGY EXAM  03/09/2016  . TETANUS/TDAP  12/17/2021  . DEXA SCAN  Completed      Discussed health benefits of physical activity, and encouraged her to engage in regular exercise appropriate for her age and condition.   1. Medicare annual wellness visit, subsequent Normal physical exam today.   2. Essential (primary) hypertension Stable. Continue current medical treatment plan of amlodipine 10mg , lisinopril-HCTZ 20-12.5mg . Will check labs as below and f/u pending results. - CBC w/Diff/Platelet - Comprehensive Metabolic Panel (CMET)  3. Type 2 diabetes mellitus with diabetic polyneuropathy, without long-term current use of insulin ([facility]) Stable. Continue current medical treatment plan with metformin 1000mg  BID. Will check labs as below and f/u pending results. - Comprehensive Metabolic Panel (CMET) - HgB A1c  4. Hypercholesteremia Stable. Continue current medical treatment plan with atorvastatin 10mg  and fish oil 1000mg . Will check labs as below and f/u pending results. - Lipid Profile  5. Benign neoplasm of right kidney Previously stable on [place] from 06/07/15. Patient is s/p left nephrectomy. Will check labs as below and f/u pending results. - Comprehensive Metabolic Panel (CMET)  6. Hemangioma of liver Noted on [place] and MRI from 06/07/15 and 06/28/15 respectively and stable. Will check labs as below and f/u pending results. - Comprehensive Metabolic Panel (CMET)  7. Need for pneumococcal vaccination Pneumococcal 23 vaccine given to patient without complications. Patient sat for 15 minutes after administration and was tolerated well without adverse effects. - Pneumococcal polysaccharide vaccine 23-valent greater than or equal to 2yo subcutaneous/IM  8. Need for influenza vaccination Flu vaccine given today without complication. Patient sat upright for 15 minutes to check for adverse reaction before being released. - Flu vaccine HIGH DOSE PF (Fluzone High dose)     ------------------------------------------------------------------------------------------------------------    Mar Daring, PA-C  [facility]

## 2015-11-30 ENCOUNTER — Telehealth: Payer: Self-pay

## 2015-11-30 LAB — COMPREHENSIVE METABOLIC PANEL
ALT: 34 IU/L — AB (ref 0–32)
AST: 19 IU/L (ref 0–40)
Albumin/Globulin Ratio: 2 (ref 1.2–2.2)
Albumin: 4.5 g/dL (ref 3.6–4.8)
Alkaline Phosphatase: 47 IU/L (ref 39–117)
BILIRUBIN TOTAL: 0.4 mg/dL (ref 0.0–1.2)
BUN/Creatinine Ratio: 19 (ref 12–28)
BUN: 19 mg/dL (ref 8–27)
CALCIUM: 9.9 mg/dL (ref 8.7–10.3)
CHLORIDE: 98 mmol/L (ref 96–106)
CO2: 28 mmol/L (ref 18–29)
Creatinine, Ser: 0.98 mg/dL (ref 0.57–1.00)
GFR calc non Af Amer: 60 mL/min/{1.73_m2} (ref >59)
GFR, EST AFRICAN AMERICAN: 69 mL/min/{1.73_m2} (ref >59)
GLUCOSE: 167 mg/dL — AB (ref 65–99)
Globulin, Total: 2.2 g/dL (ref 1.5–4.5)
Potassium: 4 mmol/L (ref 3.5–5.2)
Sodium: 141 mmol/L (ref 134–144)
TOTAL PROTEIN: 6.7 g/dL (ref 6.0–8.5)

## 2015-11-30 LAB — CBC WITH DIFFERENTIAL/PLATELET
BASOS: 1 %
Basophils Absolute: 0 10*3/uL (ref 0.0–0.2)
EOS (ABSOLUTE): 0.1 10*3/uL (ref 0.0–0.4)
EOS: 2 %
HEMATOCRIT: 36.4 % (ref 34.0–46.6)
Hemoglobin: 12 g/dL (ref 11.1–15.9)
Immature Grans (Abs): 0.1 10*3/uL (ref 0.0–0.1)
Immature Granulocytes: 1 %
LYMPHS ABS: 1.9 10*3/uL (ref 0.7–3.1)
Lymphs: 26 %
MCH: 25.1 pg — AB (ref 26.6–33.0)
MCHC: 33 g/dL (ref 31.5–35.7)
MCV: 76 fL — AB (ref 79–97)
MONOS ABS: 0.4 10*3/uL (ref 0.1–0.9)
Monocytes: 5 %
NEUTROS ABS: 4.8 10*3/uL (ref 1.4–7.0)
NEUTROS PCT: 65 %
PLATELETS: 276 10*3/uL (ref 150–379)
RBC: 4.78 x10E6/uL (ref 3.77–5.28)
RDW: 15.1 % (ref 12.3–15.4)
WBC: 7.3 10*3/uL (ref 3.4–10.8)

## 2015-11-30 LAB — LIPID PANEL
CHOLESTEROL TOTAL: 176 mg/dL (ref 100–199)
Chol/HDL Ratio: 3.6 ratio units (ref 0.0–4.4)
HDL: 49 mg/dL (ref >39)
LDL Calculated: 93 mg/dL (ref 0–99)
TRIGLYCERIDES: 168 mg/dL — AB (ref 0–149)
VLDL CHOLESTEROL CAL: 34 mg/dL (ref 5–40)

## 2015-11-30 LAB — HEMOGLOBIN A1C
Est. average glucose Bld gHb Est-mCnc: 154 mg/dL
HEMOGLOBIN A1C: 7 % — AB (ref 4.8–5.6)

## 2015-11-30 NOTE — Telephone Encounter (Signed)
-----   Message from Mar Daring, PA-C sent at 11/30/2015  8:57 AM EDT ----- Labs are fairly stable. Cholesterol stable, triglycerides improved but still slightly elevated at 168 (had been 214). Would like to get below 150. HgBA1c fairly stable but up at 7.0. Was 6.9. Continue working on healthy lifestyle modifications and increasing physical activity.

## 2015-11-30 NOTE — Telephone Encounter (Signed)
Patient advised as below. Patient verbalizes understanding and is in agreement with treatment plan.  

## 2015-11-30 NOTE — Telephone Encounter (Signed)
LMTCB

## 2015-12-13 ENCOUNTER — Other Ambulatory Visit: Payer: Self-pay | Admitting: Physician Assistant

## 2015-12-13 DIAGNOSIS — I10 Essential (primary) hypertension: Secondary | ICD-10-CM

## 2015-12-14 ENCOUNTER — Other Ambulatory Visit: Payer: Self-pay

## 2015-12-14 DIAGNOSIS — I10 Essential (primary) hypertension: Secondary | ICD-10-CM

## 2015-12-14 MED ORDER — AMLODIPINE BESYLATE 10 MG PO TABS: 10.0000 mg | ORAL_TABLET | Freq: Every day | ORAL | 3 refills | Status: DC

## 2015-12-22 ENCOUNTER — Other Ambulatory Visit: Payer: Self-pay | Admitting: Family Medicine

## 2015-12-22 DIAGNOSIS — I10 Essential (primary) hypertension: Secondary | ICD-10-CM

## 2015-12-22 NOTE — Telephone Encounter (Signed)
Please review-aa 

## 2016-03-27 ENCOUNTER — Other Ambulatory Visit: Payer: Self-pay | Admitting: Physician Assistant

## 2016-03-27 DIAGNOSIS — E119 Type 2 diabetes mellitus without complications: Secondary | ICD-10-CM

## 2016-03-27 NOTE — Telephone Encounter (Signed)
Last ov 11/29/15 Last filled 09/26/15 Last a1c checked 11/29/15 7.0 Please review. Thank you. sd

## 2016-06-24 ENCOUNTER — Other Ambulatory Visit: Payer: Self-pay | Admitting: Physician Assistant

## 2016-06-24 DIAGNOSIS — I10 Essential (primary) hypertension: Secondary | ICD-10-CM

## 2016-06-24 NOTE — Telephone Encounter (Signed)
Please review, jennis patient-aa

## 2016-06-24 NOTE — Telephone Encounter (Signed)
Needs office visit before anymore refills. °

## 2016-06-25 NOTE — Telephone Encounter (Signed)
No answer on home number or work number, called cell number and left a message-aa

## 2016-06-26 NOTE — Telephone Encounter (Signed)
lmtcb

## 2016-06-26 NOTE — Telephone Encounter (Signed)
See below-aa 

## 2016-07-02 ENCOUNTER — Other Ambulatory Visit: Payer: Self-pay | Admitting: Physician Assistant

## 2016-07-02 DIAGNOSIS — E7800 Pure hypercholesterolemia, unspecified: Secondary | ICD-10-CM

## 2016-07-02 MED ORDER — ATORVASTATIN CALCIUM 10 MG PO TABS: 10.0000 mg | ORAL_TABLET | Freq: Every evening | ORAL | 1 refills | Status: DC

## 2016-07-02 NOTE — Telephone Encounter (Signed)
Pt stated that she would like for Jenni to return her call to discuss why she needs to schedule another OV before getting refills on her atorvastatin (LIPITOR) 10 MG tablet. Last OV was 11/29/15. Pt stated that her plan was to hold off on following up for medications until August 2018 when she could get an appt with the new MD starting in our office. Pt would like enough refills on her medications to last until then but would like to speak with Tawanna Sat to see if she really has to follow up with Tawanna Sat before she schedules with the new MD. Please advise. Thanks TNP

## 2016-07-02 NOTE — Telephone Encounter (Signed)
Pt advised. FU appointment has been scheduled with Dr. Jacinto Reap for August 8th. Renaldo Fiddler, CMA

## 2016-07-02 NOTE — Telephone Encounter (Signed)
Patient does not need appt.

## 2016-07-02 NOTE — Telephone Encounter (Signed)
Pt was advised by Adriana on 06/24/2016 that she needs an OV before more refills. Lisinopril-HCTZ 20-12.5 mg 2 tablets every am was refilled for one month at that time. LOV/CPE 11/29/2015. Last refill Atorvastatin was 09/22/2015. Renaldo Fiddler, CMA

## 2016-07-04 NOTE — Telephone Encounter (Signed)
appt made-aa

## 2016-09-11 ENCOUNTER — Ambulatory Visit (INDEPENDENT_AMBULATORY_CARE_PROVIDER_SITE_OTHER): Payer: PPO | Admitting: Family Medicine

## 2016-09-11 ENCOUNTER — Encounter: Payer: Self-pay | Admitting: Family Medicine

## 2016-09-11 VITALS — BP 118/64 | HR 68 | Temp 98.0°F | Resp 16 | Wt 179.0 lb

## 2016-09-11 DIAGNOSIS — I10 Essential (primary) hypertension: Secondary | ICD-10-CM

## 2016-09-11 DIAGNOSIS — E669 Obesity, unspecified: Secondary | ICD-10-CM | POA: Insufficient documentation

## 2016-09-11 DIAGNOSIS — E119 Type 2 diabetes mellitus without complications: Secondary | ICD-10-CM

## 2016-09-11 DIAGNOSIS — E1142 Type 2 diabetes mellitus with diabetic polyneuropathy: Secondary | ICD-10-CM | POA: Diagnosis not present

## 2016-09-11 DIAGNOSIS — D3001 Benign neoplasm of right kidney: Secondary | ICD-10-CM

## 2016-09-11 DIAGNOSIS — Z6832 Body mass index (BMI) 32.0-32.9, adult: Secondary | ICD-10-CM

## 2016-09-11 LAB — POCT GLYCOSYLATED HEMOGLOBIN (HGB A1C)
Est. average glucose Bld gHb Est-mCnc: 146
HEMOGLOBIN A1C: 6.7

## 2016-09-11 MED ORDER — METFORMIN HCL 1000 MG PO TABS: 1000.0000 mg | ORAL_TABLET | Freq: Two times a day (BID) | ORAL | 5 refills | Status: DC

## 2016-09-11 MED ORDER — LISINOPRIL-HYDROCHLOROTHIAZIDE 20-12.5 MG PO TABS: 2.0000 | ORAL_TABLET | Freq: Every morning | ORAL | 6 refills | Status: DC

## 2016-09-11 MED ORDER — AMLODIPINE BESYLATE 10 MG PO TABS: 10.0000 mg | ORAL_TABLET | Freq: Every day | ORAL | 3 refills | Status: DC

## 2016-09-11 NOTE — Patient Instructions (Signed)

## 2016-09-11 NOTE — Assessment & Plan Note (Signed)
Foot exam with normal sensation No medications at this time Check B12 as patient has been on Metformin long term

## 2016-09-11 NOTE — Assessment & Plan Note (Signed)
Well controlled with A1c 6.7 Continue Metformin Check B12 as above Check CMP F/u in 6 months

## 2016-09-11 NOTE — Assessment & Plan Note (Signed)
Encouraged patient on diet and exercise and congratulated on recent weight loss

## 2016-09-11 NOTE — Progress Notes (Signed)
[name] Female    DOB: [birth year]   66 y.o.   MRN: [id number] Visit Date: 09/11/2016  Today's Provider: Lavon Paganini, MD   Chief Complaint  Patient presents with  . Diabetes  . Hypertension   Subjective:    HPI      Diabetes Mellitus Type II, Follow-up:   Lab Results  Component Value Date   HGBA1C 6.7 09/11/2016   HGBA1C 7.0 (H) 11/29/2015   HGBA1C 6.9 08/23/2015    Last seen for diabetes 10 months ago.  Management since then includes continuing medications. She reports good compliance with treatment. She is not having side effects.  Current symptoms include paresthesia of the feet and have been stable. Home blood sugar records: fasting range: 135-140 and postprandial range: 120-160's  Episodes of hypoglycemia? no   Current Insulin Regimen: N/A Most Recent Eye Exam: December 2017 Weight trend: decreasing steadily Current diet: in general, a "healthy" diet   Current exercise: walking  Pertinent Labs:    Component Value Date/Time   CHOL 176 11/29/2015 1018   TRIG 168 (H) 11/29/2015 1018   HDL 49 11/29/2015 1018   LDLCALC 93 11/29/2015 1018   CREATININE 0.98 11/29/2015 1018   CREATININE 0.92 01/11/2013 0425    Wt Readings from Last 3 Encounters:  09/11/16 179 lb (81.2 kg)  11/29/15 194 lb 6.4 oz (88.2 kg)  08/23/15 189 lb 9.6 oz (86 kg)    ------------------------------------------------------------------------  Hypertension, follow-up:  BP Readings from Last 3 Encounters:  09/11/16 118/64  11/29/15 (!) 152/80  08/23/15 140/80    She was last seen for hypertension 10 months ago.  BP at that visit was 152/50. Management since that visit includes continuing medications. She reports excellent compliance with treatment. She is not having side effects.  She is exercising. She is adherent to low salt diet.   Outside blood pressures are 120-130/60-74. She is experiencing lower extremity edema and palpitations.  Patient  denies chest pain, chest pressure/discomfort, dyspnea, exertional chest pressure/discomfort, fatigue, irregular heart beat, near-syncope and syncope.   Cardiovascular risk factors include advanced age (older than 98 for men, 110 for women), diabetes mellitus, hypertension and obesity (BMI >= 30 kg/m2).  Use of agents associated with hypertension: none.     Weight trend: decreasing steadily Wt Readings from Last 3 Encounters:  09/11/16 179 lb (81.2 kg)  11/29/15 194 lb 6.4 oz (88.2 kg)  08/23/15 189 lb 9.6 oz (86 kg)    Current diet: in general, a "healthy" diet    ------------------------------------------------------------------------  Obesity: Attempting to lose weight.  Was only able to eat 1 meal/day while husband was hospitalized which led to more weight loss.  Diet and exercise as above.   Allergies  Allergen Reactions  . Phosphate Laxative  [Sodium Phosphate]     Phosphosoda- Bad stomach cramps  . Ceclor  [Cefaclor] Rash  . Penicillins Rash     Current Outpatient Prescriptions:  .  amLODipine (NORVASC) 10 MG tablet, Take 1 tablet (10 mg total) by mouth daily., Disp: 90 tablet, Rfl: 3 .  ASPIRIN ADULT LOW STRENGTH PO, Take by mouth., Disp: , Rfl:  .  atorvastatin (LIPITOR) 10 MG tablet, Take 1 tablet (10 mg total) by mouth every evening., Disp: 90 tablet, Rfl: 1 .  Coenzyme Q10 (COQ10) 200 MG CAPS, Take 1 capsule by mouth daily., Disp: 30 capsule, Rfl: 0 .  glucose blood test strip, RELION CONFIRM/MICRO TEST (In Vitro Strip)  1 (one) Strip  Strip daily for 0 days  Quantity: 100;  Refills: 3   Ordered :29-July-2012  [name] ;  Started 29-July-2012 Active, Disp: , Rfl:  .  lisinopril-hydrochlorothiazide (PRINZIDE,ZESTORETIC) 20-12.5 MG tablet, Take 2 tablets by mouth every morning., Disp: 60 tablet, Rfl: 6 .  metFORMIN (GLUCOPHAGE) 1000 MG tablet, Take 1 tablet (1,000 mg total) by mouth 2 (two) times daily., Disp: 60 tablet, Rfl: 5 .  vitamin B-12 (CYANOCOBALAMIN) 1000  MCG tablet, Take 1,000 mcg by mouth daily., Disp: , Rfl:  .  Omega-3 Fatty Acids (FISH OIL) 1000 MG CAPS, Take by mouth. Reported on 02/22/2015, Disp: , Rfl:   Review of Systems  Constitutional: Negative for activity change, appetite change, chills, diaphoresis, fatigue, fever and unexpected weight change.  Respiratory: Negative for shortness of breath.   Cardiovascular: Positive for palpitations (long standing problem) and leg swelling (due to summer). Negative for chest pain.  Endocrine: Negative for polydipsia, polyphagia and polyuria.  Neurological: Positive for numbness.    Social History  Substance Use Topics  . Smoking status: Never Smoker  . Smokeless tobacco: Never Used  . Alcohol use No   Objective:   BP 118/64 (BP Location: Left Arm, Patient Position: Sitting, Cuff Size: Large)   Pulse 68   Temp 98 F (36.7 C) (Oral)   Resp 16   Wt 179 lb (81.2 kg)   BMI 32.74 kg/m  Vitals:   09/11/16 0831  BP: 118/64  Pulse: 68  Resp: 16  Temp: 98 F (36.7 C)  TempSrc: Oral  Weight: 179 lb (81.2 kg)     Physical Exam  Constitutional: She is oriented to person, place, and time. She appears well-developed and well-nourished. No distress.  HENT:  Head: Normocephalic and atraumatic.  Mouth/Throat: Oropharynx is clear and moist.  Eyes: Pupils are equal, round, and reactive to light. Conjunctivae and EOM are normal. No scleral icterus.  Neck: Neck supple.  Cardiovascular: Normal rate, regular rhythm, normal heart sounds and intact distal pulses.   No murmur heard. Pulmonary/Chest: Effort normal and breath sounds normal. No respiratory distress. She has no wheezes.  Abdominal: Soft. Bowel sounds are normal. She exhibits no distension. There is no tenderness. There is no rebound.  Musculoskeletal: She exhibits no edema or deformity.  Neurological: She is alert and oriented to person, place, and time.  Skin: Skin is warm and dry. No rash noted.  Psychiatric: She has a normal mood  and affect. Her behavior is normal.  Nursing note and vitals reviewed.   Diabetic Foot Check -  Appearance - no lesions, ulcers or calluses Skin - no unusual pallor or redness Monofilament testing -  Right - Great toe, medial, central, lateral ball and posterior foot intact Left - Great toe, medial, central, lateral ball and posterior foot intact      Assessment & Plan:         Essential (primary) hypertension Well controlled Continue current meds  Check CMP F/u in 6 months  Diabetic neuropathy ([place]) Foot exam with normal sensation No medications at this time Check B12 as patient has been on Metformin long term  Diabetes mellitus, type 2 Well controlled with A1c 6.7 Continue Metformin Check B12 as above Check CMP F/u in 6 months  Benign neoplasm of kidney Patient has previously been monitored with q2y 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

## 2016-09-11 NOTE — Assessment & Plan Note (Signed)
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

## 2016-09-11 NOTE — Assessment & Plan Note (Signed)
Well controlled Continue current meds Check CMP F/u in 6 months 

## 2016-09-12 ENCOUNTER — Telehealth: Payer: Self-pay

## 2016-09-12 LAB — COMPREHENSIVE METABOLIC PANEL
ALT: 15 IU/L (ref 0–32)
AST: 15 IU/L (ref 0–40)
Albumin/Globulin Ratio: 2.1 (ref 1.2–2.2)
Albumin: 4.7 g/dL (ref 3.6–4.8)
Alkaline Phosphatase: 51 IU/L (ref 39–117)
BUN/Creatinine Ratio: 17 (ref 12–28)
BUN: 17 mg/dL (ref 8–27)
Bilirubin Total: 0.4 mg/dL (ref 0.0–1.2)
CALCIUM: 10.1 mg/dL (ref 8.7–10.3)
CO2: 24 mmol/L (ref 20–29)
CREATININE: 1 mg/dL (ref 0.57–1.00)
Chloride: 98 mmol/L (ref 96–106)
GFR, EST AFRICAN AMERICAN: 67 mL/min/{1.73_m2} (ref >59)
GFR, EST NON AFRICAN AMERICAN: 58 mL/min/{1.73_m2} — AB (ref >59)
Globulin, Total: 2.2 g/dL (ref 1.5–4.5)
Glucose: 143 mg/dL — ABNORMAL HIGH (ref 65–99)
Potassium: 4.4 mmol/L (ref 3.5–5.2)
Sodium: 141 mmol/L (ref 134–144)
TOTAL PROTEIN: 6.9 g/dL (ref 6.0–8.5)

## 2016-09-12 LAB — VITAMIN B12: Vitamin B-12: 503 pg/mL (ref 232–1245)

## 2016-09-12 NOTE — Telephone Encounter (Signed)
Pt informed and voiced understanding of results. 

## 2016-09-12 NOTE — Telephone Encounter (Signed)
-----   Message from Virginia Crews, MD sent at 09/12/2016  8:58 AM EDT ----- Labs are stable.  Electrolytes, kidney function, and liver function wnl.  B12 level is normal.  No need to supplement B12 at this time.  Virginia Crews, MD, MPH [facility] 09/12/2016 8:58 AM

## 2016-09-12 NOTE — Telephone Encounter (Signed)
lmtcb

## 2016-09-17 ENCOUNTER — Encounter: Payer: Self-pay | Admitting: Family Medicine

## 2016-09-18 ENCOUNTER — Ambulatory Visit
Admission: RE | Admit: 2016-09-18 | Discharge: 2016-09-18 | Disposition: A | Discharge status: Home / Self Care | Payer: PPO | Source: Ambulatory Visit | Attending: Physician Assistant | Admitting: Physician Assistant

## 2016-09-18 DIAGNOSIS — Z905 Acquired absence of kidney: Secondary | ICD-10-CM | POA: Insufficient documentation

## 2016-09-18 DIAGNOSIS — N281 Cyst of kidney, acquired: Secondary | ICD-10-CM | POA: Diagnosis not present

## 2016-09-18 DIAGNOSIS — D3001 Benign neoplasm of right kidney: Secondary | ICD-10-CM

## 2016-09-18 DIAGNOSIS — D3000 Benign neoplasm of unspecified kidney: Secondary | ICD-10-CM | POA: Diagnosis not present

## 2016-09-19 ENCOUNTER — Telehealth: Payer: Self-pay

## 2016-09-19 ENCOUNTER — Other Ambulatory Visit: Payer: Self-pay | Admitting: Family Medicine

## 2016-09-19 DIAGNOSIS — D3001 Benign neoplasm of right kidney: Secondary | ICD-10-CM

## 2016-09-19 NOTE — Telephone Encounter (Signed)
Referral placed to Urology who can re-evaluate.  Karen Crews, MD, MPH [facility] 09/19/2016 12:06 PM

## 2016-09-19 NOTE — Telephone Encounter (Signed)
Pt advised. She was concerned because she was previously informed that this mass has "not grown at all since 2013". She states she seems more concerned about this than the people who are following this. Please advise.

## 2016-09-19 NOTE — Telephone Encounter (Signed)
Pt wants to see Dr. Jacqlyn Larsen who followed this initially. She states her insurance does not require referral, and would like for this referral to be cancelled.

## 2016-09-19 NOTE — Telephone Encounter (Signed)
-----   Message from Virginia Crews, MD sent at 09/19/2016 10:02 AM EDT ----- Radiology reports that R kidney mass has grown from 86mm to 34mm in size since 2010 (the last study they can see in our system), but according to the ultrasound in 2016, it is the same size.  We will continue to monitor.  Virginia Crews, MD, MPH [facility] 09/19/2016 10:02 AM

## 2016-09-19 NOTE — Telephone Encounter (Signed)
Referral canceled. Referral coordinator advised.  Virginia Crews, MD, MPH [facility] 09/19/2016 1:35 PM

## 2016-09-30 ENCOUNTER — Other Ambulatory Visit: Payer: Self-pay | Admitting: Physician Assistant

## 2016-09-30 DIAGNOSIS — E7800 Pure hypercholesterolemia, unspecified: Secondary | ICD-10-CM

## 2016-10-01 NOTE — Telephone Encounter (Signed)
Last ov  09/11/16 Last filled 07/02/16 Please review. Thank you. sd

## 2016-11-02 ENCOUNTER — Ambulatory Visit (INDEPENDENT_AMBULATORY_CARE_PROVIDER_SITE_OTHER): Payer: PPO

## 2016-11-02 DIAGNOSIS — Z23 Encounter for immunization: Secondary | ICD-10-CM | POA: Diagnosis not present

## 2016-12-03 DIAGNOSIS — Z6833 Body mass index (BMI) 33.0-33.9, adult: Secondary | ICD-10-CM | POA: Diagnosis not present

## 2016-12-03 DIAGNOSIS — D3001 Benign neoplasm of right kidney: Secondary | ICD-10-CM | POA: Diagnosis not present

## 2016-12-03 DIAGNOSIS — D1771 Benign lipomatous neoplasm of kidney: Secondary | ICD-10-CM | POA: Insufficient documentation

## 2016-12-18 ENCOUNTER — Ambulatory Visit (INDEPENDENT_AMBULATORY_CARE_PROVIDER_SITE_OTHER): Payer: PPO | Admitting: Family Medicine

## 2016-12-18 ENCOUNTER — Encounter: Payer: Self-pay | Admitting: Family Medicine

## 2016-12-18 VITALS — BP 162/80 | HR 80 | Temp 98.6°F | Resp 16 | Ht 62.0 in | Wt 183.0 lb

## 2016-12-18 DIAGNOSIS — M8589 Other specified disorders of bone density and structure, multiple sites: Secondary | ICD-10-CM | POA: Diagnosis not present

## 2016-12-18 DIAGNOSIS — E7800 Pure hypercholesterolemia, unspecified: Secondary | ICD-10-CM

## 2016-12-18 DIAGNOSIS — Z6832 Body mass index (BMI) 32.0-32.9, adult: Secondary | ICD-10-CM

## 2016-12-18 DIAGNOSIS — Z1211 Encounter for screening for malignant neoplasm of colon: Secondary | ICD-10-CM | POA: Diagnosis not present

## 2016-12-18 DIAGNOSIS — E1142 Type 2 diabetes mellitus with diabetic polyneuropathy: Secondary | ICD-10-CM

## 2016-12-18 DIAGNOSIS — E669 Obesity, unspecified: Secondary | ICD-10-CM | POA: Diagnosis not present

## 2016-12-18 DIAGNOSIS — Z1159 Encounter for screening for other viral diseases: Secondary | ICD-10-CM

## 2016-12-18 DIAGNOSIS — M8580 Other specified disorders of bone density and structure, unspecified site: Secondary | ICD-10-CM | POA: Insufficient documentation

## 2016-12-18 DIAGNOSIS — E66811 Obesity, class 1: Secondary | ICD-10-CM

## 2016-12-18 DIAGNOSIS — Z0000 Encounter for general adult medical examination without abnormal findings: Secondary | ICD-10-CM

## 2016-12-18 DIAGNOSIS — Z1231 Encounter for screening mammogram for malignant neoplasm of breast: Secondary | ICD-10-CM

## 2016-12-18 LAB — POCT UA - MICROALBUMIN: MICROALBUMIN (UR) POC: 20 mg/L

## 2016-12-18 MED ORDER — ATORVASTATIN CALCIUM 10 MG PO TABS: 10.0000 mg | ORAL_TABLET | Freq: Every evening | ORAL | 3 refills | Status: DC

## 2016-12-18 NOTE — Assessment & Plan Note (Signed)
Previously well controlled Up-to-date on foot exam Recheck A1c Continue metformin Scheduled for eye exam F/u in 6 months

## 2016-12-18 NOTE — Assessment & Plan Note (Signed)
Recheck DEXA to see interval change Discussed exercise, not smoking, maintaining healthy weight, and Ca/Vit D

## 2016-12-18 NOTE — Patient Instructions (Signed)
Preventive Care 65 Years and Older, Female Preventive care refers to lifestyle choices and visits with your health care provider that can promote health and wellness. What does preventive care include?  A yearly physical exam. This is also called an annual well check.  Dental exams once or twice a year.  Routine eye exams. Ask your health care provider how often you should have your eyes checked.  Personal lifestyle choices, including: ? Daily care of your teeth and gums. ? Regular physical activity. ? Eating a healthy diet. ? Avoiding tobacco and drug use. ? Limiting alcohol use. ? Practicing safe sex. ? Taking low-dose aspirin every day. ? Taking vitamin and mineral supplements as recommended by your health care provider. What happens during an annual well check? The services and screenings done by your health care provider during your annual well check will depend on your age, overall health, lifestyle risk factors, and family history of disease. Counseling Your health care provider may ask you questions about your:  Alcohol use.  Tobacco use.  Drug use.  Emotional well-being.  Home and relationship well-being.  Sexual activity.  Eating habits.  History of falls.  Memory and ability to understand (cognition).  Work and work environment.  Reproductive health.  Screening You may have the following tests or measurements:  Height, weight, and BMI.  Blood pressure.  Lipid and cholesterol levels. These may be checked every 5 years, or more frequently if you are over 50 years old.  Skin check.  Lung cancer screening. You may have this screening every year starting at age 55 if you have a 30-pack-year history of smoking and currently smoke or have quit within the past 15 years.  Fecal occult blood test (FOBT) of the stool. You may have this test every year starting at age 50.  Flexible sigmoidoscopy or colonoscopy. You may have a sigmoidoscopy every 5 years or  a colonoscopy every 10 years starting at age 50.  Hepatitis C blood test.  Hepatitis B blood test.  Sexually transmitted disease (STD) testing.  Diabetes screening. This is done by checking your blood sugar (glucose) after you have not eaten for a while (fasting). You may have this done every 1-3 years.  Bone density scan. This is done to screen for osteoporosis. You may have this done starting at age 65.  Mammogram. This may be done every 1-2 years. Talk to your health care provider about how often you should have regular mammograms.  Talk with your health care provider about your test results, treatment options, and if necessary, the need for more tests. Vaccines Your health care provider may recommend certain vaccines, such as:  Influenza vaccine. This is recommended every year.  Tetanus, diphtheria, and acellular pertussis (Tdap, Td) vaccine. You may need a Td booster every 10 years.  Varicella vaccine. You may need this if you have not been vaccinated.  Zoster vaccine. You may need this after age 60.  Measles, mumps, and rubella (MMR) vaccine. You may need at least one dose of MMR if you were born in 1957 or later. You may also need a second dose.  Pneumococcal 13-valent conjugate (PCV13) vaccine. One dose is recommended after age 65.  Pneumococcal polysaccharide (PPSV23) vaccine. One dose is recommended after age 65.  Meningococcal vaccine. You may need this if you have certain conditions.  Hepatitis A vaccine. You may need this if you have certain conditions or if you travel or work in places where you may be exposed to hepatitis   A.  Hepatitis B vaccine. You may need this if you have certain conditions or if you travel or work in places where you may be exposed to hepatitis B.  Haemophilus influenzae type b (Hib) vaccine. You may need this if you have certain conditions.  Talk to your health care provider about which screenings and vaccines you need and how often you  need them. This information is not intended to replace advice given to you by your health care provider. Make sure you discuss any questions you have with your health care provider. Document Released: 02/17/2015 Document Revised: 10/11/2015 Document Reviewed: 11/22/2014 Elsevier Interactive Patient Education  2017 [employer].

## 2016-12-18 NOTE — Progress Notes (Signed)
[name], Female    DOB: 07-23-1950, 66 y.o.   MRN: [id number] Visit Date: 12/18/2016  Today's Provider: Lavon Paganini, MD   Chief Complaint  Patient presents with  . Medicare Wellness  . Diabetes   Subjective:    Annual wellness visit Karen W Bjorklund is a 66 y.o. female. She feels well. She reports exercising daily. Walking for 30-90 minutes. She reports she is sleeping well.  Last mammogram- 2005- BI-RADS 1 Last colonoscopy- never; hesitant about referral Last BMD- 02/16/2014- osteopenia  -----------------------------------------------------------  Diabetes Mellitus Type II, Follow-up:   Lab Results  Component Value Date   HGBA1C 6.7 09/11/2016   HGBA1C 7.0 (H) 11/29/2015   HGBA1C 6.9 08/23/2015    Last seen for diabetes 3 months ago.  Management since then includes continue medications. She reports good compliance with treatment. She is not having side effects.  Current symptoms include paresthesia of the feet and have been stable. Home blood sugar records: fasting range: 110ish and postprandial range: 140-150  Episodes of hypoglycemia? yes - sometimes will go as low as 80; this has not occurred "in a long time".   Current Insulin Regimen: N/A Most Recent Eye Exam: due Weight trend: fluctuating a bit Current diet: in general, a "healthy" diet   Current exercise: walking  Has eye exam scheduled for 01/2017  Pertinent Labs:    Component Value Date/Time   CHOL 176 11/29/2015 1018   TRIG 168 (H) 11/29/2015 1018   HDL 49 11/29/2015 1018   LDLCALC 93 11/29/2015 1018   CREATININE 1.00 09/11/2016 0941   CREATININE 0.92 01/11/2013 0425    Wt Readings from Last 3 Encounters:  12/18/16 183 lb (83 kg)  09/11/16 179 lb (81.2 kg)  11/29/15 194 lb 6.4 oz (88.2 kg)    ------------------------------------------------------------------------   Review of Systems  Constitutional: Negative.   HENT: Negative.   Eyes: Negative.     Respiratory: Negative.   Cardiovascular: Negative.   Gastrointestinal: Negative.   Endocrine: Negative.   Genitourinary: Negative.   Musculoskeletal: Positive for arthralgias. Negative for back pain, gait problem, joint swelling, myalgias, neck pain and neck stiffness.  Skin: Negative.   Allergic/Immunologic: Negative.   Neurological: Negative.   Hematological: Negative.   Psychiatric/Behavioral: Negative.     Social History   Socioeconomic History  . Marital status: Married    Spouse name: Balinda Quails  . Number of children: 1  . Years of education: 69  . Highest education level: Not on file  Social Needs  . Financial resource strain: Not on file  . Food insecurity - worry: Not on file  . Food insecurity - inability: Not on file  . Transportation needs - medical: Not on file  . Transportation needs - non-medical: Not on file  Occupational History    Employer: OTHER    Comment: Adult nurse  Tobacco Use  . Smoking status: Never Smoker  . Smokeless tobacco: Never Used  Substance and Sexual Activity  . Alcohol use: Yes    Comment: social  . Drug use: No  . Sexual activity: Not Currently  Other Topics Concern  . Not on file  Social History Narrative  . Not on file    History reviewed. No pertinent past medical history.   Patient Active Problem List   Diagnosis Date Noted  . Obesity 09/11/2016  . Diabetic neuropathy ([place]) 11/29/2015  . Headache 11/17/2014  . Hemangioma of liver 08/31/2014  . Benign neoplasm of kidney  06/21/2014  . Diabetes mellitus, type 2 ([place]) 06/21/2014  . Essential (primary) hypertension 06/21/2014  . Blood in the urine 06/21/2014  . Hypercholesteremia 06/21/2014  . Burning or prickling sensation 06/21/2014  . Benign melanoma 06/21/2014  . Fibroid 06/21/2014  . Avitaminosis D 06/21/2014  . 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 HERNIA REPAIR  01/2013   Dr. Burt Knack  .  LIPOMA EXCISION     Removed from back.  . 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 RENAL CYST EXCISION Right   . SKIN CANCER EXCISION    . UTERINE FIBROID SURGERY      Her family history includes Alzheimer's disease in her maternal grandfather; Cancer in her paternal grandfather and paternal grandmother; Congestive Heart Failure in her father; Prostate cancer in her father; Stroke in her maternal grandfather, maternal grandmother, and mother.      Current Outpatient Medications:  .  amLODipine (NORVASC) 10 MG tablet, Take 1 tablet (10 mg total) by mouth daily., Disp: 90 tablet, Rfl: 3 .  ASPIRIN ADULT LOW STRENGTH PO, Take by mouth., Disp: , Rfl:  .  atorvastatin (LIPITOR) 10 MG tablet, TAKE ONE TABLET EVERY EVENING, Disp: 90 tablet, Rfl: 3 .  Coenzyme Q10 (COQ10) 200 MG CAPS, Take 1 capsule by mouth daily., Disp: 30 capsule, Rfl: 0 .  glucose blood test strip, RELION CONFIRM/MICRO TEST (In Vitro Strip)  1 (one) Strip Strip daily for 0 days  Quantity: 100;  Refills: 3   Ordered :29-July-2012  [name] ;  Started 29-July-2012 Active, Disp: , Rfl:  .  metFORMIN (GLUCOPHAGE) 1000 MG tablet, Take 1 tablet (1,000 mg total) by mouth 2 (two) times daily., Disp: 60 tablet, Rfl: 5 .  Omega-3 Fatty Acids (FISH OIL) 1000 MG CAPS, Take by mouth. Reported on 02/22/2015, Disp: , Rfl:  .  lisinopril-hydrochlorothiazide (PRINZIDE,ZESTORETIC) 20-12.5 MG tablet, Take 2 tablets by mouth every morning., Disp: 60 tablet, Rfl: 6  Patient Care Team: Virginia Crews, MD as PCP - General (Family Medicine)     Objective:   Vitals: BP (!) 162/80 (BP Location: Left Arm, Patient Position: Sitting, Cuff Size: Large) Comment: did not take BP medication  Pulse 80   Temp 98.6 F (37 C) (Oral)   Resp 16   Ht 5\' 2"  (1.575 m)   Wt 183 lb (83 kg)   BMI 33.47 kg/m   Physical Exam  Constitutional: She is oriented to person, place, and time. She appears well-developed and well-nourished.  No distress.  HENT:  Head: Normocephalic and atraumatic.  Right Ear: External ear normal.  Left Ear: External ear normal.  Nose: Nose normal.  Mouth/Throat: Oropharynx is clear and moist. No oropharyngeal exudate.  Eyes: Conjunctivae are normal. Pupils are equal, round, and reactive to light. No scleral icterus.  Neck: Neck supple. No thyromegaly present.  Cardiovascular: Normal rate, regular rhythm, normal heart sounds and intact distal pulses.  No murmur heard. Pulmonary/Chest: Effort normal and breath sounds normal. No respiratory distress. She has no wheezes. She has no rales.  Breasts: breasts appear normal, no suspicious masses, no skin or nipple changes or axillary nodes.  Abdominal: Soft. Bowel sounds are normal. She exhibits no distension. There is no tenderness. There is no rebound and no guarding.  Musculoskeletal: She exhibits no edema or deformity.  Lymphadenopathy:    She has no cervical adenopathy.  Neurological: She is alert and oriented to person, place,  and time.  Skin: Skin is warm and dry. No rash noted.  Psychiatric: She has a normal mood and affect. Her behavior is normal.  Vitals reviewed.   Activities of Daily Living In your present state of health, do you have any difficulty performing the following activities: 12/18/2016  Hearing? N  Vision? N  Difficulty concentrating or making decisions? N  Walking or climbing stairs? N  Dressing or bathing? N  Doing errands, shopping? N  Some recent data might be hidden    Fall Risk Assessment Fall Risk  12/18/2016 11/29/2015 08/31/2014  Falls in the past year? No No No     Depression Screen PHQ 2/9 Scores 12/18/2016 11/29/2015 08/31/2014  PHQ - 2 Score 0 0 0    Cognitive Testing - 6-CIT  Correct? Score   What year is it? yes 0 0 or 4  What month is it? yes 0 0 or 3  Memorize:    Pia Mau,  40,  [place],      What time is it? (within 1 hour) yes 0 0 or 3  Count backwards from 20 yes 0 0, 2,  or 4  Name the months of the year yes 0 0, 2, or 4  Repeat name & address above yes 0 0, 2, 4, 6, 8, or 10       TOTAL SCORE  0/28   Interpretation:  Normal  Normal (0-7) Abnormal (8-28)    Audit-C Alcohol Use Screening  Question Answer Points  How often do you have alcoholic drink? 1 times monthly 1  On days you do drink alcohol, how many drinks do you typically consume? 1-2 0  How oftey will you drink 6 or more in a total? never 0  Total Score:  1   A score of 3 or more in women, and 4 or more in men indicates increased risk for alcohol abuse, EXCEPT if all of the points are from question 1.     Assessment & Plan:     Annual Wellness Visit  Reviewed patient's Family Medical History Reviewed and updated list of patient's medical providers Assessment of cognitive impairment was done Assessed patient's functional ability Established a written schedule for health screening [place] Completed and Reviewed  Exercise Activities and Dietary recommendations Goals    None      Immunization History  Administered Date(s) Administered  . Influenza, High Dose Seasonal PF 11/30/2014, 11/29/2015, 11/02/2016  . Pneumococcal Conjugate-13 01/05/2014  . Pneumococcal Polysaccharide-23 12/01/2004, 11/29/2015  . Td 12/26/1995, 12/18/2011  . Tdap 12/18/2011    Health Maintenance  Topic Date Due  . Hepatitis C Screening  04-11-1950  . MAMMOGRAM  11/12/1998  . COLONOSCOPY  11/12/1998  . OPHTHALMOLOGY EXAM  03/09/2016  . HEMOGLOBIN A1C  03/14/2017  . FOOT EXAM  09/11/2017  . TETANUS/TDAP  12/17/2021  . INFLUENZA VACCINE  Completed  . DEXA SCAN  Completed  . PNA vac Low Risk Adult  Completed     Discussed health benefits of physical activity, and encouraged her to engage in regular exercise appropriate for her age and condition.     Problem List Items Addressed This Visit      Endocrine   Diabetes mellitus, type 2 ([place])    Previously well  controlled Up-to-date on foot exam Recheck A1c Continue metformin Scheduled for eye exam F/u in 6 months      Relevant Medications   atorvastatin (LIPITOR) 10 MG tablet  Other Relevant Orders   POCT UA - Microalbumin (Completed)   Hemoglobin A1c     Musculoskeletal and Integument   Osteopenia    Recheck DEXA to see interval change Discussed exercise, not smoking, maintaining healthy weight, and Ca/Vit D      Relevant Orders   DG Bone Density     Other   Hypercholesteremia    Continue lipitor Recheck lipids, CMP      Relevant Medications   atorvastatin (LIPITOR) 10 MG tablet   Other Relevant Orders   Lipid panel   Obesity    Discussed diet and exercise      Relevant Orders   CBC    Other Visit Diagnoses    Encounter for Medicare annual wellness exam    -  Primary   Need for hepatitis C screening test       Relevant Orders   Hepatitis C Antibody   Screening for colon cancer       Relevant Orders   Ambulatory referral to Gastroenterology   Encounter for screening mammogram for breast cancer       Relevant Orders   MM SCREENING BREAST TOMO BILATERAL      ------------------------------------------------------------------------------------------------------------  Return in about 6 months (around 06/17/2017) for diabetes f/u.   The entirety of the information documented in the History of Present Illness, Review of Systems and Physical Exam were personally obtained by me. Portions of this information were initially documented by Raquel Sarna Ratchford, CMA and reviewed by me for thoroughness and accuracy.     Lavon Paganini, MD  [facility]

## 2016-12-18 NOTE — Assessment & Plan Note (Signed)
Discussed diet and exercise 

## 2016-12-18 NOTE — Assessment & Plan Note (Signed)
Continue lipitor Recheck lipids, CMP

## 2016-12-19 ENCOUNTER — Telehealth: Payer: Self-pay

## 2016-12-19 LAB — CBC
HCT: 39.9 % (ref 35.0–45.0)
Hemoglobin: 13 g/dL (ref 11.7–15.5)
MCH: 24.2 pg — ABNORMAL LOW (ref 27.0–33.0)
MCHC: 32.6 g/dL (ref 32.0–36.0)
MCV: 74.3 fL — ABNORMAL LOW (ref 80.0–100.0)
MPV: 11.7 fL (ref 7.5–12.5)
PLATELETS: 284 10*3/uL (ref 140–400)
RBC: 5.37 10*6/uL — ABNORMAL HIGH (ref 3.80–5.10)
RDW: 14.8 % (ref 11.0–15.0)
WBC: 6.7 10*3/uL (ref 3.8–10.8)

## 2016-12-19 LAB — LIPID PANEL
Cholesterol: 184 mg/dL (ref <200)
HDL: 63 mg/dL (ref >50)
LDL Cholesterol (Calc): 95 mg/dL (calc)
NON-HDL CHOLESTEROL (CALC): 121 mg/dL (ref <130)
Total CHOL/HDL Ratio: 2.9 (calc) (ref <5.0)
Triglycerides: 159 mg/dL — ABNORMAL HIGH (ref <150)

## 2016-12-19 LAB — HEMOGLOBIN A1C
EAG (MMOL/L): 8.2 (calc)
HEMOGLOBIN A1C: 6.8 %{Hb} — AB (ref <5.7)
MEAN PLASMA GLUCOSE: 148 (calc)

## 2016-12-19 LAB — HEPATITIS C ANTIBODY
Hepatitis C Ab: NONREACTIVE
SIGNAL TO CUT-OFF: 0.01 (ref <1.00)

## 2016-12-19 NOTE — Telephone Encounter (Signed)
Pt advised of results. 

## 2016-12-19 NOTE — Telephone Encounter (Signed)
-----   Message from Virginia Crews, MD sent at 12/19/2016  9:03 AM EST ----- Cholesterol is good. A1c is at goal (6.8).  [place] blood counts. Negative Hep C screening  Virginia Crews, MD, MPH [facility] 12/19/2016 9:03 AM

## 2017-01-20 ENCOUNTER — Telehealth: Payer: Self-pay | Admitting: Family Medicine

## 2017-01-20 NOTE — Telephone Encounter (Signed)
Pt is returning call.  CB#[phone]/MW

## 2017-01-20 NOTE — Telephone Encounter (Signed)
Patient called saying that Judson Roch called her to see which Dr she would like to see to have her colonoscopy done. Did you call the patient?

## 2017-02-05 ENCOUNTER — Other Ambulatory Visit: Payer: PPO

## 2017-02-12 DIAGNOSIS — H0015 Chalazion left lower eyelid: Secondary | ICD-10-CM | POA: Diagnosis not present

## 2017-02-12 LAB — HM DIABETES EYE EXAM

## 2017-03-05 ENCOUNTER — Telehealth: Payer: Self-pay

## 2017-03-05 ENCOUNTER — Ambulatory Visit
Admission: RE | Admit: 2017-03-05 | Discharge: 2017-03-05 | Disposition: A | Discharge status: Home / Self Care | Payer: PPO | Source: Ambulatory Visit | Attending: Family Medicine | Admitting: Family Medicine

## 2017-03-05 DIAGNOSIS — M8589 Other specified disorders of bone density and structure, multiple sites: Secondary | ICD-10-CM

## 2017-03-05 DIAGNOSIS — Z1231 Encounter for screening mammogram for malignant neoplasm of breast: Secondary | ICD-10-CM | POA: Diagnosis not present

## 2017-03-05 DIAGNOSIS — M8588 Other specified disorders of bone density and structure, other site: Secondary | ICD-10-CM | POA: Diagnosis not present

## 2017-03-05 DIAGNOSIS — Z780 Asymptomatic menopausal state: Secondary | ICD-10-CM | POA: Diagnosis not present

## 2017-03-05 NOTE — Telephone Encounter (Signed)
-----   Message from Virginia Crews, MD sent at 03/05/2017 10:03 AM EST ----- Bone density scans shows osteopenia (this is some bone loss, but not as bad as osteoporosis).  Recommend regular weight bearing exercise, avoiding smoking, and adequate Ca (1200mg /day) and Vit D (1000 units daily) via diet or supplement.  We will recheck in 2 years to ensure this hasn't worsened.  Virginia Crews, MD, MPH [facility] 03/05/2017 10:03 AM

## 2017-03-05 NOTE — Telephone Encounter (Signed)
Left message advising pt. OK per DPR. 

## 2017-03-06 ENCOUNTER — Telehealth: Payer: Self-pay

## 2017-03-06 NOTE — Telephone Encounter (Signed)
Pt advised.

## 2017-03-06 NOTE — Telephone Encounter (Signed)
-----   Message from Virginia Crews, MD sent at 03/05/2017 12:43 PM EST ----- Normal mammogram  Bacigalupo, Dionne Bucy, MD, MPH [facility] 03/05/2017 12:43 PM

## 2017-03-11 ENCOUNTER — Encounter: Payer: Self-pay | Admitting: Family Medicine

## 2017-04-02 ENCOUNTER — Other Ambulatory Visit: Payer: Self-pay | Admitting: Family Medicine

## 2017-04-02 DIAGNOSIS — E119 Type 2 diabetes mellitus without complications: Secondary | ICD-10-CM

## 2017-04-23 ENCOUNTER — Other Ambulatory Visit: Payer: Self-pay | Admitting: Physician Assistant

## 2017-04-23 DIAGNOSIS — I10 Essential (primary) hypertension: Secondary | ICD-10-CM

## 2017-04-30 ENCOUNTER — Other Ambulatory Visit: Payer: Self-pay | Admitting: Family Medicine

## 2017-04-30 DIAGNOSIS — E119 Type 2 diabetes mellitus without complications: Secondary | ICD-10-CM

## 2017-06-18 ENCOUNTER — Ambulatory Visit (INDEPENDENT_AMBULATORY_CARE_PROVIDER_SITE_OTHER): Payer: PPO | Admitting: Family Medicine

## 2017-06-18 ENCOUNTER — Telehealth: Payer: Self-pay | Admitting: Family Medicine

## 2017-06-18 ENCOUNTER — Ambulatory Visit: Payer: Self-pay | Admitting: Family Medicine

## 2017-06-18 ENCOUNTER — Encounter: Payer: Self-pay | Admitting: Family Medicine

## 2017-06-18 VITALS — BP 130/70 | HR 64 | Temp 98.0°F | Resp 16 | Wt 182.0 lb

## 2017-06-18 DIAGNOSIS — E7800 Pure hypercholesterolemia, unspecified: Secondary | ICD-10-CM | POA: Diagnosis not present

## 2017-06-18 DIAGNOSIS — E669 Obesity, unspecified: Secondary | ICD-10-CM

## 2017-06-18 DIAGNOSIS — E119 Type 2 diabetes mellitus without complications: Secondary | ICD-10-CM | POA: Diagnosis not present

## 2017-06-18 DIAGNOSIS — Z1211 Encounter for screening for malignant neoplasm of colon: Secondary | ICD-10-CM

## 2017-06-18 DIAGNOSIS — I10 Essential (primary) hypertension: Secondary | ICD-10-CM | POA: Diagnosis not present

## 2017-06-18 DIAGNOSIS — E1165 Type 2 diabetes mellitus with hyperglycemia: Secondary | ICD-10-CM

## 2017-06-18 DIAGNOSIS — Z6833 Body mass index (BMI) 33.0-33.9, adult: Secondary | ICD-10-CM

## 2017-06-18 LAB — POCT GLYCOSYLATED HEMOGLOBIN (HGB A1C)
Est. average glucose Bld gHb Est-mCnc: 143
Hemoglobin A1C: 6.6

## 2017-06-18 MED ORDER — METFORMIN HCL 1000 MG PO TABS: 1000.0000 mg | ORAL_TABLET | Freq: Two times a day (BID) | ORAL | 3 refills | Status: DC

## 2017-06-18 MED ORDER — ATORVASTATIN CALCIUM 10 MG PO TABS: 10.0000 mg | ORAL_TABLET | Freq: Every evening | ORAL | 3 refills | Status: DC

## 2017-06-18 MED ORDER — LISINOPRIL-HYDROCHLOROTHIAZIDE 20-12.5 MG PO TABS: 2.0000 | ORAL_TABLET | Freq: Every morning | ORAL | 3 refills | Status: DC

## 2017-06-18 MED ORDER — AMLODIPINE BESYLATE 10 MG PO TABS: 10.0000 mg | ORAL_TABLET | Freq: Every day | ORAL | 3 refills | Status: DC

## 2017-06-18 NOTE — Assessment & Plan Note (Signed)
Refilled Lipitor

## 2017-06-18 NOTE — Assessment & Plan Note (Signed)
Discussed diet and exercise 

## 2017-06-18 NOTE — Telephone Encounter (Signed)
Order for cologuard faxed to [facility] °

## 2017-06-18 NOTE — Progress Notes (Signed)
[name] Female    DOB: Jun 09, 1950   67 y.o.   MRN: [id number] Visit Date: 06/18/2017  Today's Provider: Lavon Paganini, MD   I, Martha Clan, CMA, am acting as scribe for Lavon Paganini, MD.  Chief Complaint  Patient presents with  . Diabetes  . Hypertension   Subjective:    HPI      Diabetes Mellitus Type II, Follow-up:   Lab Results  Component Value Date   HGBA1C CANCELED 12/18/2016   HGBA1C 6.8 (H) 12/18/2016   HGBA1C CANCELED 12/18/2016    Last seen for diabetes 6 months ago.  Management since then includes continuing Metformin. She reports good compliance with treatment. She is not having side effects.  Current symptoms include paresthesia of the feet and have been worsening. Home blood sugar records: fasting range: 140-160  Episodes of hypoglycemia? no   Most Recent Eye Exam: 02/12/2017- negative Weight trend: stable Current diet: in general, a "healthy" diet   Current exercise: walking  Pertinent Labs:    Component Value Date/Time   CHOL 184 12/18/2016 1112   CHOL 176 11/29/2015 1018   TRIG CANCELED 12/18/2016 1207   HDL 63 12/18/2016 1112   HDL 49 11/29/2015 1018   LDLCALC CANCELED 12/18/2016 1207   CREATININE 1.00 09/11/2016 0941   CREATININE 0.92 01/11/2013 0425    Wt Readings from Last 3 Encounters:  06/18/17 182 lb (82.6 kg)  12/18/16 183 lb (83 kg)  09/11/16 179 lb (81.2 kg)    ------------------------------------------------------------------------  Hypertension, follow-up:  BP Readings from Last 3 Encounters:  06/18/17 130/70  12/18/16 (!) 162/80  09/11/16 118/64    She was last seen for hypertension 6 months ago.  BP at that visit was 162/80. Management since that visit includes none (pt did not take her BP medication prior to LOV). She reports good compliance with treatment. She is not having side effects. She is concerned about her amlodipine, as she heard that this could cause cancer. She is  adherent to low salt diet.   Outside blood pressures are improving per pt. She is experiencing fatigue, lower extremity edema and palpitations.  Patient denies chest pain, chest pressure/discomfort, claudication, dyspnea, exertional chest pressure/discomfort, irregular heart beat, near-syncope and syncope.   Cardiovascular risk factors include advanced age (older than 74 for men, 19 for women), diabetes mellitus, dyslipidemia, hypertension and obesity (BMI >= 30 kg/m2).  Use of agents associated with hypertension: none.    ------------------------------------------------------------------------   Allergies  Allergen Reactions  . Phosphate Laxative  [Sodium Phosphate]     Phosphosoda- Bad stomach cramps  . Ceclor  [Cefaclor] Rash  . Penicillins Rash     Current Outpatient Medications:  .  amLODipine (NORVASC) 10 MG tablet, Take 1 tablet (10 mg total) by mouth daily., Disp: 90 tablet, Rfl: 3 .  ASPIRIN ADULT LOW STRENGTH PO, Take by mouth., Disp: , Rfl:  .  atorvastatin (LIPITOR) 10 MG tablet, Take 1 tablet (10 mg total) every evening by mouth., Disp: 90 tablet, Rfl: 3 .  Coenzyme Q10 (COQ10) 200 MG CAPS, Take 1 capsule by mouth daily., Disp: 30 capsule, Rfl: 0 .  glucose blood test strip, RELION CONFIRM/MICRO TEST (In Vitro Strip)  1 (one) Strip Strip daily for 0 days  Quantity: 100;  Refills: 3   Ordered :29-July-2012  [name] ;  Started 29-July-2012 Active, Disp: , Rfl:  .  lisinopril-hydrochlorothiazide (PRINZIDE,ZESTORETIC) 20-12.5 MG tablet, Take 2 tablets by mouth every morning., Disp:  60 tablet, Rfl: 1 .  metFORMIN (GLUCOPHAGE) 1000 MG tablet, TAKE ONE TABLET BY MOUTH TWICE DAILY, Disp: 60 tablet, Rfl: 5 .  Multiple Vitamins-Minerals (MULTIVITAMIN ADULTS PO), Take by mouth., Disp: , Rfl:  .  Omega-3 Fatty Acids (FISH OIL) 1000 MG CAPS, Take by mouth. Reported on 02/22/2015, Disp: , Rfl:   Review of Systems  Constitutional: Positive for fatigue. Negative for activity  change, appetite change, chills, diaphoresis, fever and unexpected weight change.  Eyes: Negative for visual disturbance.  Respiratory: Negative for shortness of breath.   Cardiovascular: Positive for palpitations and leg swelling. Negative for chest pain.  Endocrine: Negative for polydipsia, polyphagia and polyuria.    Social History   Tobacco Use  . Smoking status: Never Smoker  . Smokeless tobacco: Never Used  Substance Use Topics  . Alcohol use: Yes    Comment: social   Objective:   BP 130/70 (BP Location: Left Arm, Patient Position: Sitting, Cuff Size: Large)   Pulse 64   Temp 98 F (36.7 C) (Oral)   Resp 16   Wt 182 lb (82.6 kg)   SpO2 97%   BMI 33.29 kg/m  Vitals:   06/18/17 0806  BP: 130/70  Pulse: 64  Resp: 16  Temp: 98 F (36.7 C)  TempSrc: Oral  SpO2: 97%  Weight: 182 lb (82.6 kg)     Physical Exam  Constitutional: She is oriented to person, place, and time. She appears well-developed and well-nourished. No distress.  HENT:  Head: Normocephalic and atraumatic.  Eyes: Conjunctivae are normal.  Cardiovascular: Normal rate, regular rhythm, normal heart sounds and intact distal pulses.  No murmur heard. Pulmonary/Chest: Effort normal and breath sounds normal. No respiratory distress. She has no wheezes. She has no rales.  Musculoskeletal: She exhibits no edema or deformity.  Neurological: She is alert and oriented to person, place, and time.  Skin: Skin is warm and dry. Capillary refill takes less than 2 seconds.  Psychiatric: She has a normal mood and affect. Her behavior is normal.  Vitals reviewed.   Results for orders placed or performed in visit on 06/18/17  POCT glycosylated hemoglobin (Hb A1C)  Result Value Ref Range   Hemoglobin A1C 6.6    Est. average glucose Bld gHb Est-mCnc 143       Assessment & Plan:     Problem List Items Addressed This Visit      Cardiovascular and Mediastinum   Essential (primary) hypertension     Well-controlled Continue current meds Recheck BMP Follow-up in 6 months      Relevant Medications   amLODipine (NORVASC) 10 MG tablet   atorvastatin (LIPITOR) 10 MG tablet   lisinopril-hydrochlorothiazide (PRINZIDE,ZESTORETIC) 20-12.5 MG tablet   Other Relevant Orders   Basic Metabolic Panel (BMET)     Endocrine   Diabetes mellitus, type 2 (HCC) - Primary    Well-controlled A1c down from 6.8-6.6 today Up-to-date on foot exam, eye exam, vaccinations Not a candidate for microalbumin testing as she is on an ACE inhibitor  Continue metformin at current dose follow-up in 6 months      Relevant Medications   atorvastatin (LIPITOR) 10 MG tablet   lisinopril-hydrochlorothiazide (PRINZIDE,ZESTORETIC) 20-12.5 MG tablet   metFORMIN (GLUCOPHAGE) 1000 MG tablet   Other Relevant Orders   POCT glycosylated hemoglobin (Hb A1C) (Completed)     Other   Hypercholesteremia    Refilled Lipitor      Relevant Medications   amLODipine (NORVASC) 10 MG tablet   atorvastatin (LIPITOR) 10  MG tablet   lisinopril-hydrochlorothiazide (PRINZIDE,ZESTORETIC) 20-12.5 MG tablet   Obesity    Discussed diet and exercise      Relevant Medications   metFORMIN (GLUCOPHAGE) 1000 MG tablet    Other Visit Diagnoses    Screening for colon cancer       Relevant Orders   Cologuard       Return in about 6 months (around 12/19/2017) for [place].   The entirety of the information documented in the History of Present Illness, Review of Systems and Physical Exam were personally obtained by me. Portions of this information were initially documented by Raquel Sarna Ratchford, CMA and reviewed by me for thoroughness and accuracy.    Virginia Crews, MD, MPH [facility] 06/18/2017 9:13 AM

## 2017-06-18 NOTE — Assessment & Plan Note (Signed)
Well controlled Continue current meds Recheck BMP Follow-up in 6 months 

## 2017-06-18 NOTE — Assessment & Plan Note (Signed)
Well-controlled A1c down from 6.8-6.6 today Up-to-date on foot exam, eye exam, vaccinations Not a candidate for microalbumin testing as she is on an ACE inhibitor  Continue metformin at current dose follow-up in 6 months

## 2017-06-19 ENCOUNTER — Telehealth: Payer: Self-pay

## 2017-06-19 LAB — BASIC METABOLIC PANEL
BUN/Creatinine Ratio: 22 (ref 12–28)
BUN: 21 mg/dL (ref 8–27)
CHLORIDE: 98 mmol/L (ref 96–106)
CO2: 25 mmol/L (ref 20–29)
CREATININE: 0.94 mg/dL (ref 0.57–1.00)
Calcium: 10 mg/dL (ref 8.7–10.3)
GFR calc non Af Amer: 63 mL/min/{1.73_m2} (ref >59)
GFR, EST AFRICAN AMERICAN: 72 mL/min/{1.73_m2} (ref >59)
Glucose: 147 mg/dL — ABNORMAL HIGH (ref 65–99)
Potassium: 3.8 mmol/L (ref 3.5–5.2)
Sodium: 141 mmol/L (ref 134–144)

## 2017-06-19 NOTE — Telephone Encounter (Signed)
Pt advised.

## 2017-06-19 NOTE — Telephone Encounter (Signed)
-----   Message from Virginia Crews, MD sent at 06/19/2017 10:08 AM EDT ----- Normal kidney function and electrolytes.  Blood sugar remains elevated  Virginia Crews, MD, MPH [facility] 06/19/2017 10:08 AM

## 2017-08-21 ENCOUNTER — Telehealth: Payer: Self-pay

## 2017-08-21 NOTE — Telephone Encounter (Signed)
-----   Message from Virginia Crews, MD sent at 08/19/2017 12:18 PM EDT ----- Could you check with the patient to see if she is willing to complete colon cancer screening.  I think we have to reorder her Cologuard at this time.  ----- Message ----- From: Mindi Curling, CMA Sent: 08/19/2017  11:24 AM To: Virginia Crews, MD    ----- Message ----- From: Judye Bos D Sent: 08/19/2017  10:00 AM To: [place], [place]  Review incoming fax

## 2017-08-21 NOTE — Telephone Encounter (Signed)
Pt states she is planning on sending this off.

## 2017-11-29 ENCOUNTER — Ambulatory Visit (INDEPENDENT_AMBULATORY_CARE_PROVIDER_SITE_OTHER): Payer: PPO

## 2017-11-29 DIAGNOSIS — Z23 Encounter for immunization: Secondary | ICD-10-CM

## 2017-12-31 ENCOUNTER — Ambulatory Visit: Payer: Self-pay

## 2017-12-31 ENCOUNTER — Encounter: Payer: Self-pay | Admitting: Family Medicine

## 2018-03-11 ENCOUNTER — Encounter: Payer: Self-pay | Admitting: Family Medicine

## 2018-04-08 ENCOUNTER — Ambulatory Visit (INDEPENDENT_AMBULATORY_CARE_PROVIDER_SITE_OTHER): Payer: PPO

## 2018-04-08 ENCOUNTER — Encounter: Payer: Self-pay | Admitting: Family Medicine

## 2018-04-08 ENCOUNTER — Ambulatory Visit (INDEPENDENT_AMBULATORY_CARE_PROVIDER_SITE_OTHER): Payer: PPO | Admitting: Family Medicine

## 2018-04-08 VITALS — BP 137/82 | HR 66 | Temp 98.8°F | Ht 62.0 in | Wt 186.6 lb

## 2018-04-08 VITALS — BP 144/68 | HR 66 | Temp 98.8°F | Ht 62.0 in | Wt 186.6 lb

## 2018-04-08 DIAGNOSIS — E1169 Type 2 diabetes mellitus with other specified complication: Secondary | ICD-10-CM | POA: Diagnosis not present

## 2018-04-08 DIAGNOSIS — E669 Obesity, unspecified: Secondary | ICD-10-CM

## 2018-04-08 DIAGNOSIS — E785 Hyperlipidemia, unspecified: Secondary | ICD-10-CM | POA: Diagnosis not present

## 2018-04-08 DIAGNOSIS — Z1211 Encounter for screening for malignant neoplasm of colon: Secondary | ICD-10-CM

## 2018-04-08 DIAGNOSIS — Z0000 Encounter for general adult medical examination without abnormal findings: Secondary | ICD-10-CM

## 2018-04-08 DIAGNOSIS — I10 Essential (primary) hypertension: Secondary | ICD-10-CM | POA: Diagnosis not present

## 2018-04-08 DIAGNOSIS — Z6834 Body mass index (BMI) 34.0-34.9, adult: Secondary | ICD-10-CM

## 2018-04-08 DIAGNOSIS — E1142 Type 2 diabetes mellitus with diabetic polyneuropathy: Secondary | ICD-10-CM | POA: Diagnosis not present

## 2018-04-08 NOTE — Patient Instructions (Addendum)
Karen Olsen , Thank you for taking time to come for your Medicare Wellness Visit. I appreciate your ongoing commitment to your health goals. Please review the following plan we discussed and let me know if I can assist you in the future.   Screening recommendations/referrals: Colonoscopy: Pt declines referral and cologuard order today.  Mammogram: Up to date, due 02/2019 Bone Density: Up to date, due 02/2022 Recommended yearly ophthalmology/optometry visit for glaucoma screening and checkup Recommended yearly dental visit for hygiene and checkup  Vaccinations: Influenza vaccine: Up to date Pneumococcal vaccine: Completed series Tdap vaccine: Up to date, due 12/2021 Shingles vaccine: Pt declines today.     Advanced directives: Currently on file.   Conditions/risks identified: Obesity- recommend to decrease portion sizes by eating 3 small healthy meals and at least 2 healthy snacks per day.  Next appointment: 10:40 AM today with Dr Mariann Barter.    Preventive Care 70 Years and Older, Female Preventive care refers to lifestyle choices and visits with your health care provider that can promote health and wellness. What does preventive care include?  A yearly physical exam. This is also called an annual well check.  Dental exams once or twice a year.  Routine eye exams. Ask your health care provider how often you should have your eyes checked.  Personal lifestyle choices, including:  Daily care of your teeth and gums.  Regular physical activity.  Eating a healthy diet.  Avoiding tobacco and drug use.  Limiting alcohol use.  Practicing safe sex.  Taking low-dose aspirin every day.  Taking vitamin and mineral supplements as recommended by your health care provider. What happens during an annual well check? The services and screenings done by your health care provider during your annual well check will depend on your age, overall health, lifestyle risk factors, and family history  of disease. Counseling  Your health care provider may ask you questions about your:  Alcohol use.  Tobacco use.  Drug use.  Emotional well-being.  Home and relationship well-being.  Sexual activity.  Eating habits.  History of falls.  Memory and ability to understand (cognition).  Work and work Statistician.  Reproductive health. Screening  You may have the following tests or measurements:  Height, weight, and BMI.  Blood pressure.  Lipid and cholesterol levels. These may be checked every 5 years, or more frequently if you are over 1 years old.  Skin check.  Lung cancer screening. You may have this screening every year starting at age 13 if you have a 30-pack-year history of smoking and currently smoke or have quit within the past 15 years.  Fecal occult blood test (FOBT) of the stool. You may have this test every year starting at age 59.  Flexible sigmoidoscopy or colonoscopy. You may have a sigmoidoscopy every 5 years or a colonoscopy every 10 years starting at age 31.  Hepatitis C blood test.  Hepatitis B blood test.  Sexually transmitted disease (STD) testing.  Diabetes screening. This is done by checking your blood sugar (glucose) after you have not eaten for a while (fasting). You may have this done every 1-3 years.  Bone density scan. This is done to screen for osteoporosis. You may have this done starting at age 39.  Mammogram. This may be done every 1-2 years. Talk to your health care provider about how often you should have regular mammograms. Talk with your health care provider about your test results, treatment options, and if necessary, the need for more tests. Vaccines  Your health care provider may recommend certain vaccines, such as:  Influenza vaccine. This is recommended every year.  Tetanus, diphtheria, and acellular pertussis (Tdap, Td) vaccine. You may need a Td booster every 10 years.  Zoster vaccine. You may need this after age  74.  Pneumococcal 13-valent conjugate (PCV13) vaccine. One dose is recommended after age 19.  Pneumococcal polysaccharide (PPSV23) vaccine. One dose is recommended after age 2. Talk to your health care provider about which screenings and vaccines you need and how often you need them. This information is not intended to replace advice given to you by your health care provider. Make sure you discuss any questions you have with your health care provider. Document Released: 02/17/2015 Document Revised: 10/11/2015 Document Reviewed: 11/22/2014 Elsevier Interactive Patient Education  2017 [place] Prevention in the Home Falls can cause injuries. They can happen to people of all ages. There are many things you can do to make your home safe and to help prevent falls. What can I do on the outside of my home?  Regularly fix the edges of walkways and driveways and fix any cracks.  Remove anything that might make you trip as you walk through a door, such as a raised step or threshold.  Trim any bushes or trees on the path to your home.  Use bright outdoor lighting.  Clear any walking paths of anything that might make someone trip, such as rocks or tools.  Regularly check to see if handrails are loose or broken. Make sure that both sides of any steps have handrails.  Any raised decks and porches should have guardrails on the edges.  Have any leaves, snow, or ice cleared regularly.  Use sand or salt on walking paths during winter.  Clean up any spills in your garage right away. This includes oil or grease spills. What can I do in the bathroom?  Use night lights.  Install grab bars by the toilet and in the tub and shower. Do not use towel bars as grab bars.  Use non-skid mats or decals in the tub or shower.  If you need to sit down in the shower, use a plastic, non-slip stool.  Keep the floor dry. Clean up any water that spills on the floor as soon as it happens.  Remove  soap buildup in the tub or shower regularly.  Attach bath mats securely with double-sided non-slip rug tape.  Do not have throw rugs and other things on the floor that can make you trip. What can I do in the bedroom?  Use night lights.  Make sure that you have a light by your bed that is easy to reach.  Do not use any sheets or blankets that are too big for your bed. They should not hang down onto the floor.  Have a firm chair that has side arms. You can use this for support while you get dressed.  Do not have throw rugs and other things on the floor that can make you trip. What can I do in the kitchen?  Clean up any spills right away.  Avoid walking on wet floors.  Keep items that you use a lot in easy-to-reach places.  If you need to reach something above you, use a strong step stool that has a grab bar.  Keep electrical cords out of the [place].  Do not use floor polish or wax that makes floors slippery. If you must use wax, use non-skid floor wax.  Do  not have throw rugs and other things on the floor that can make you trip. What can I do with my stairs?  Do not leave any items on the stairs.  Make sure that there are handrails on both sides of the stairs and use them. Fix handrails that are broken or loose. Make sure that handrails are as long as the stairways.  Check any carpeting to make sure that it is firmly attached to the stairs. Fix any carpet that is loose or worn.  Avoid having throw rugs at the top or bottom of the stairs. If you do have throw rugs, attach them to the floor with carpet tape.  Make sure that you have a light switch at the top of the stairs and the bottom of the stairs. If you do not have them, ask someone to add them for you. What else can I do to help prevent falls?  Wear shoes that:  Do not have high heels.  Have rubber bottoms.  Are comfortable and fit you well.  Are closed at the toe. Do not wear sandals.  If you use a  stepladder:  Make sure that it is fully opened. Do not climb a closed stepladder.  Make sure that both sides of the stepladder are locked into place.  Ask someone to hold it for you, if possible.  Clearly mark and make sure that you can see:  Any grab bars or handrails.  First and last steps.  Where the edge of each step is.  Use tools that help you move around (mobility aids) if they are needed. These include:  Canes.  Walkers.  Scooters.  Crutches.  Turn on the lights when you go into a dark area. Replace any light bulbs as soon as they burn out.  Set up your furniture so you have a clear path. Avoid moving your furniture around.  If any of your floors are uneven, fix them.  If there are any pets around you, be aware of where they are.  Review your medicines with your doctor. Some medicines can make you feel dizzy. This can increase your chance of falling. Ask your doctor what other things that you can do to help prevent falls. This information is not intended to replace advice given to you by your health care provider. Make sure you discuss any questions you have with your health care provider. Document Released: 11/17/2008 Document Revised: 06/29/2015 Document Reviewed: 02/25/2014 Elsevier Interactive Patient Education  2017 [employer].

## 2018-04-08 NOTE — Patient Instructions (Signed)
Preventive Care 70 Years and Older, Female Preventive care refers to lifestyle choices and visits with your health care provider that can promote health and wellness. What does preventive care include?  A yearly physical exam. This is also called an annual well check.  Dental exams once or twice a year.  Routine eye exams. Ask your health care provider how often you should have your eyes checked.  Personal lifestyle choices, including: ? Daily care of your teeth and gums. ? Regular physical activity. ? Eating a healthy diet. ? Avoiding tobacco and drug use. ? Limiting alcohol use. ? Practicing safe sex. ? Taking low-dose aspirin every day. ? Taking vitamin and mineral supplements as recommended by your health care provider. What happens during an annual well check? The services and screenings done by your health care provider during your annual well check will depend on your age, overall health, lifestyle risk factors, and family history of disease. Counseling Your health care provider may ask you questions about your:  Alcohol use.  Tobacco use.  Drug use.  Emotional well-being.  Home and relationship well-being.  Sexual activity.  Eating habits.  History of falls.  Memory and ability to understand (cognition).  Work and work Statistician.  Reproductive health.  Screening You may have the following tests or measurements:  Height, weight, and BMI.  Blood pressure.  Lipid and cholesterol levels. These may be checked every 5 years, or more frequently if you are over 30 years old.  Skin check.  Lung cancer screening. You may have this screening every year starting at age 27 if you have a 30-pack-year history of smoking and currently smoke or have quit within the past 15 years.  Colorectal cancer screening. All adults should have this screening starting at age 33 and continuing until age 46. You will have tests every 1-10 years, depending on your results and the  type of screening test. People at increased risk should start screening at an earlier age. Screening tests may include: ? Guaiac-based fecal occult blood testing. ? Fecal immunochemical test (FIT). ? Stool DNA test. ? Virtual colonoscopy. ? Sigmoidoscopy. During this test, a flexible tube with a tiny camera (sigmoidoscope) is used to examine your rectum and lower colon. The sigmoidoscope is inserted through your anus into your rectum and lower colon. ? Colonoscopy. During this test, a long, thin, flexible tube with a tiny camera (colonoscope) is used to examine your entire colon and rectum.  Hepatitis C blood test.  Hepatitis B blood test.  Sexually transmitted disease (STD) testing.  Diabetes screening. This is done by checking your blood sugar (glucose) after you have not eaten for a while (fasting). You may have this done every 1-3 years.  Bone density scan. This is done to screen for osteoporosis. You may have this done starting at age 37.  Mammogram. This may be done every 1-2 years. Talk to your health care provider about how often you should have regular mammograms. Talk with your health care provider about your test results, treatment options, and if necessary, the need for more tests. Vaccines Your health care provider may recommend certain vaccines, such as:  Influenza vaccine. This is recommended every year.  Tetanus, diphtheria, and acellular pertussis (Tdap, Td) vaccine. You may need a Td booster every 10 years.  Varicella vaccine. You may need this if you have not been vaccinated.  Zoster vaccine. You may need this after age 38.  Measles, mumps, and rubella (MMR) vaccine. You may need at least  one dose of MMR if you were born in 1957 or later. You may also need a second dose.  Pneumococcal 13-valent conjugate (PCV13) vaccine. One dose is recommended after age 24.  Pneumococcal polysaccharide (PPSV23) vaccine. One dose is recommended after age 24.  Meningococcal  vaccine. You may need this if you have certain conditions.  Hepatitis A vaccine. You may need this if you have certain conditions or if you travel or work in places where you may be exposed to hepatitis A.  Hepatitis B vaccine. You may need this if you have certain conditions or if you travel or work in places where you may be exposed to hepatitis B.  Haemophilus influenzae type b (Hib) vaccine. You may need this if you have certain conditions. Talk to your health care provider about which screenings and vaccines you need and how often you need them. This information is not intended to replace advice given to you by your health care provider. Make sure you discuss any questions you have with your health care provider. Document Released: 02/17/2015 Document Revised: 03/13/2017 Document Reviewed: 11/22/2014 Elsevier Interactive Patient Education  2019 [employer].

## 2018-04-08 NOTE — Assessment & Plan Note (Signed)
Continue atorvastatin at current dose Recheck FLP and CMP

## 2018-04-08 NOTE — Assessment & Plan Note (Signed)
Foot exam completed today with normal sensation No medications at this time B12 checked recently and within normal limits

## 2018-04-08 NOTE — Progress Notes (Addendum)
[name], Female    DOB: [birth year], 68 y.o.   MRN: [id number] Visit Date: 04/08/2018  Today's Provider: Lavon Paganini, MD   Chief Complaint  Patient presents with  . Annual Exam   Subjective:    I, Tiburcio Pea, CMA, am acting as a scribe for Lavon Paganini, MD.   Patient had a AWE with [facility] prior to appointment   Complete Physical Karen Olsen is a 68 y.o. female. She feels well. She reports exercising daily walking. She reports she is sleeping fairly well.   HTN: - Medications: amlodipine 10mg  daily, lisinopril-hctz 40-25mg  daily - Compliance: good - Checking BP at home: yes, in general <140/90, but occasionally jumps to 160s/100 - no pattern to it - Denies any SOB, CP, vision changes, LE edema, medication SEs, or symptoms of hypotension - Diet: low sodium  T2DM - Checking BG at home: no - Medications: metformin 1000mg  BID - Compliance: good - Diet: low carb - eye exam: going to schedule - foot exam: needs  - microalbumin: n/a ACEi - denies symptoms of hypoglycemia, polyuria, polydipsia, numbness extremities, foot ulcers/trauma  -----------------------------------------------------------   Review of Systems  Constitutional: Negative.   HENT: Negative.   Eyes: Positive for discharge and itching.  Respiratory: Negative.   Cardiovascular: Negative.   Gastrointestinal: Negative.   Endocrine: Negative.   Genitourinary: Negative.   Musculoskeletal: Positive for arthralgias.  Skin: Negative.   Allergic/Immunologic: Positive for environmental allergies.  Neurological: Negative.   Hematological: Negative.   Psychiatric/Behavioral: Negative.     Social History   Socioeconomic History  . Marital status: Married    Spouse name: Balinda Quails  . Number of children: 1  . Years of education: 68  . Highest education level: Associate degree: occupational, Hotel manager, or vocational program  Occupational History    Employer: OTHER    Comment:  Adult nurse  Social Needs  . Financial resource strain: Not hard at all  . Food insecurity:    Worry: Never true    Inability: Never true  . Transportation needs:    Medical: No    Non-medical: No  Tobacco Use  . Smoking status: Never Smoker  . Smokeless tobacco: Never Used  Substance and Sexual Activity  . Alcohol use: Yes    Comment: social  . Drug use: No  . Sexual activity: Not Currently  Lifestyle  . Physical activity:    Days per week: 0 days    Minutes per session: 0 min  . Stress: Not at all  Relationships  . Social connections:    Talks on phone: Patient refused    Gets together: Patient refused    Attends religious service: Patient refused    Active member of club or organization: Patient refused    Attends meetings of clubs or organizations: Patient refused    Relationship status: Patient refused  . Intimate partner violence:    Fear of current or ex partner: Patient refused    Emotionally abused: Patient refused    Physically abused: Patient refused    Forced sexual activity: Patient refused  Other Topics Concern  . Not on file  Social History Narrative  . Not on file    Past Medical History:  Diagnosis Date  . Diabetes mellitus without complication ([place])   . Hyperlipidemia   . Hypertension      Patient Active Problem List   Diagnosis Date Noted  . Osteopenia 12/18/2016  . 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  Obesity 09/11/2016  . Diabetic neuropathy ([place]) 11/29/2015  . Headache 11/17/2014  . Hemangioma of liver 08/31/2014  . Benign neoplasm of kidney 06/21/2014  . Diabetes mellitus, type 2 ([place]) 06/21/2014  . Essential (primary) hypertension 06/21/2014  . Blood in the urine 06/21/2014  . Hyperlipidemia associated with type 2 diabetes mellitus ([place]) 06/21/2014  . Burning or prickling sensation 06/21/2014  . Benign melanoma 06/21/2014  . Fibroid 06/21/2014  . Avitaminosis D 06/21/2014  . 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 HERNIA REPAIR  01/2013   Dr. Burt Knack  . LIPOMA EXCISION     Removed from back.  . 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 RENAL CYST EXCISION Right   . SKIN CANCER EXCISION    . UTERINE FIBROID SURGERY      Her family history includes Alzheimer's disease in her maternal grandfather; Cancer in her paternal grandfather and paternal grandmother; Congestive Heart Failure in her father; Prostate cancer in her father; Stroke in her maternal grandfather, maternal grandmother, and mother. There is no history of Breast cancer.   Current Outpatient Medications:  .  acetaminophen (TYLENOL) 500 MG tablet, Take 500 mg by mouth every 6 (six) hours as needed., Disp: , Rfl:  .  amLODipine (NORVASC) 10 MG tablet, Take 1 tablet (10 mg total) by mouth daily., Disp: 90 tablet, Rfl: 3 .  ASPIRIN ADULT LOW STRENGTH PO, Take 81 mg by mouth daily. , Disp: , Rfl:  .  atorvastatin (LIPITOR) 10 MG tablet, Take 1 tablet (10 mg total) by mouth every evening., Disp: 90 tablet, Rfl: 3 .  Coenzyme Q10 (COQ10) 200 MG CAPS, Take 1 capsule by mouth daily. (Patient taking differently: Take 100 mg by mouth daily. ), Disp: 30 capsule, Rfl: 0 .  glucose blood test strip, RELION CONFIRM/MICRO TEST (In Vitro Strip)  1 (one) Strip Strip daily for 0 days  Quantity: 100;  Refills: 3   Ordered :29-July-2012  [name] ;  Started 29-July-2012 Active, Disp: , Rfl:  .  lisinopril-hydrochlorothiazide (PRINZIDE,ZESTORETIC) 20-12.5 MG tablet, Take 2 tablets by mouth every morning., Disp: 180 tablet, Rfl: 3 .  metFORMIN (GLUCOPHAGE) 1000 MG tablet, Take 1 tablet (1,000 mg total) by mouth 2 (two) times daily., Disp: 180 tablet, Rfl: 3 .  Multiple Vitamins-Minerals (MULTIVITAMIN ADULTS PO), Take by mouth daily. , Disp: , Rfl:  .  Omega-3 Fatty Acids (FISH OIL) 1000 MG CAPS, Take by mouth. Reported on 02/22/2015, Disp: , Rfl:   Patient Care  Team: Virginia Crews, MD as PCP - General (Family Medicine) [place], [place] ([facility])     Objective:    Vitals: BP 137/82   Pulse 66   Temp 98.8 F (37.1 C) (Oral)   Ht 5\' 2"  (1.575 m)   Wt 186 lb 9.6 oz (84.6 kg)   BMI 34.13 kg/m   Physical Exam Vitals signs reviewed.  Constitutional:      General: She is not in acute distress.    Appearance: Normal appearance. She is well-developed. She is not diaphoretic.  HENT:     Head: Normocephalic and atraumatic.     Right Ear: Tympanic membrane, ear canal and external ear normal.     Left Ear: Tympanic membrane, ear canal and external ear normal.     Nose: Nose normal.     Mouth/Throat:     Mouth: Mucous membranes are moist.     Pharynx:  Oropharynx is clear. No oropharyngeal exudate.  Eyes:     General: No scleral icterus.    Conjunctiva/sclera: Conjunctivae normal.     Pupils: Pupils are equal, round, and reactive to light.  Neck:     Musculoskeletal: Neck supple.     Thyroid: No thyromegaly.  Cardiovascular:     Rate and Rhythm: Normal rate and regular rhythm.     Pulses: Normal pulses.     Heart sounds: Normal heart sounds. No murmur.  Pulmonary:     Effort: Pulmonary effort is normal. No respiratory distress.     Breath sounds: Normal breath sounds. No wheezing or rales.  Abdominal:     General: Bowel sounds are normal. There is no distension.     Palpations: Abdomen is soft.     Tenderness: There is no abdominal tenderness. There is no guarding or rebound.  Musculoskeletal:        General: No deformity.     Right lower leg: No edema.     Left lower leg: No edema.  Lymphadenopathy:     Cervical: No cervical adenopathy.  Skin:    General: Skin is warm and dry.     Capillary Refill: Capillary refill takes less than 2 seconds.     Findings: No rash.  Neurological:     Mental Status: She is alert and oriented to person, place, and time. Mental status is at baseline.  Psychiatric:        Mood and  Affect: Mood normal.        Behavior: Behavior normal.        Thought Content: Thought content normal.     Diabetic Foot Exam - Simple   Simple Foot Form Diabetic Foot exam was performed with the following findings:  Yes 04/08/2018 11:00 AM  Visual Inspection No deformities, no ulcerations, no other skin breakdown bilaterally:  Yes Sensation Testing Intact to touch and monofilament testing bilaterally:  Yes Pulse Check Posterior Tibialis and Dorsalis pulse intact bilaterally:  Yes Comments    Feeds  Activities of Daily Living In your present state of health, do you have any difficulty performing the following activities: 04/08/2018  Hearing? N  Vision? N  Difficulty concentrating or making decisions? N  Walking or climbing stairs? N  Dressing or bathing? N  Doing errands, shopping? N  Preparing Food and eating ? N  Using the Toilet? N  In the past six months, have you accidently leaked urine? N  Do you have problems with loss of bowel control? N  Managing your Medications? N  Managing your Finances? N  Housekeeping or managing your Housekeeping? N  Some recent data might be hidden    Fall Risk Assessment Fall Risk  04/08/2018 12/18/2016 11/29/2015 08/31/2014  Falls in the past year? 0 No No No     Depression Screen PHQ 2/9 Scores 04/08/2018 04/08/2018 12/18/2016 11/29/2015  PHQ - 2 Score 0 0 0 0  PHQ- 9 Score 0 - - -    6CIT Screen 04/08/2018  What Year? 0 points  What month? 0 points  What time? 0 points  Count back from 20 0 points  Months in reverse 0 points  Repeat phrase 0 points  Total Score 0       Assessment & Plan:    Annual Physical Reviewed patient's Family Medical History Reviewed and updated list of patient's medical providers Assessment of cognitive impairment was done Assessed patient's functional ability Established a written schedule for health screening services Health  Risk Assessent Completed and Reviewed  Exercise Activities and Dietary  recommendations Goals    . Have 3 meals a day     Recommend to decrease portion sizes by eating 3 small healthy meals and at least 2 healthy snacks per day.       Immunization History  Administered Date(s) Administered  . Influenza Split 11/30/2005, 10/14/2009, 12/18/2011  . Influenza, High Dose Seasonal PF 11/30/2014, 11/29/2015, 11/02/2016, 11/29/2017  . Influenza,inj,Quad PF,6+ Mos 11/04/2012, 10/13/2013  . Pneumococcal Conjugate-13 01/05/2014  . Pneumococcal Polysaccharide-23 12/01/2004, 11/29/2015  . Td 12/26/1995, 12/18/2011  . Tdap 12/18/2011    Health Maintenance  Topic Date Due  . COLONOSCOPY  11/12/1998  . FOOT EXAM  09/11/2017  . HEMOGLOBIN A1C  12/19/2017  . OPHTHALMOLOGY EXAM  02/12/2018  . MAMMOGRAM  03/06/2019  . TETANUS/TDAP  12/17/2021  . DEXA SCAN  03/05/2022  . INFLUENZA VACCINE  Completed  . Hepatitis C Screening  Completed  . PNA vac Low Risk Adult  Completed     Discussed health benefits of physical activity, and encouraged her to engage in regular exercise appropriate for her age and condition.    ------------------------------------------------------------------------------------------------------------  Problem List Items Addressed This Visit      Cardiovascular and Mediastinum   Essential (primary) hypertension    Slightly elevated today with some times of elevation at home but in general well controlled at home Continue current meds and will switch to bedtime dosing Recheck metabolic panel Patient to call if she has more elevation in her blood pressure Discussed low-sodium diet Follow-up in 6 months      Relevant Orders   Comprehensive metabolic panel     Endocrine   Diabetes mellitus, type 2 (HCC)    Previously well controlled Continue metformin at current dose On ACE inhibitor On a statin Foot exam completed today Up-to-date on vaccinations Advised to schedule repeat eye exam Recheck A1c Follow-up in 6 months      Relevant  Orders   Hemoglobin A1c   Hyperlipidemia associated with type 2 diabetes mellitus ([place])    Continue atorvastatin at current dose Recheck FLP and CMP       Relevant Orders   Comprehensive metabolic panel   Lipid panel   Diabetic neuropathy ([place])    Foot exam completed today with normal sensation No medications at this time B12 checked recently and within normal limits        Other   Obesity    Discussed importance of healthy weight management, diet, exercise      Relevant Orders   Hemoglobin A1c   Comprehensive metabolic panel   Lipid panel    Other Visit Diagnoses    Encounter for annual physical exam    -  Primary   Screen for colon cancer        -Patient completed Cologuard, but it was not picked up by [employer] and she does not want to go through this again -She refuses colonoscopy -We will do FOBT testing instead   Return in about 6 months (around 10/09/2018) for Chronic disease f/u.   The entirety of the information documented in the History of Present Illness, Review of Systems and Physical Exam were personally obtained by me. Portions of this information were initially documented by Tiburcio Pea, CMA and reviewed by me for thoroughness and accuracy.    Virginia Crews, MD, MPH [facility] 04/08/2018 11:10 AM

## 2018-04-08 NOTE — Assessment & Plan Note (Signed)
Previously well controlled Continue metformin at current dose On ACE inhibitor On a statin Foot exam completed today Up-to-date on vaccinations Advised to schedule repeat eye exam Recheck A1c Follow-up in 6 months

## 2018-04-08 NOTE — Assessment & Plan Note (Signed)
Slightly elevated today with some times of elevation at home but in general well controlled at home Continue current meds and will switch to bedtime dosing Recheck metabolic panel Patient to call if she has more elevation in her blood pressure Discussed low-sodium diet Follow-up in 6 months

## 2018-04-08 NOTE — Assessment & Plan Note (Signed)
Discussed importance of healthy weight management, diet, exercise

## 2018-04-08 NOTE — Progress Notes (Signed)
Subjective:   Karen Olsen is a 68 y.o. female who presents for Medicare Annual (Subsequent) preventive examination.  Review of Systems:  N/A  Cardiac Risk Factors include: advanced age (>39men, >56 women);diabetes mellitus;dyslipidemia;hypertension;obesity (BMI >30kg/m2)     Objective:     Vitals: BP (!) 144/68 (BP Location: Right Arm)   Pulse 66   Temp 98.8 F (37.1 C) (Oral)   Ht 5\' 2"  (1.575 m)   Wt 186 lb 9.6 oz (84.6 kg)   BMI 34.13 kg/m   Body mass index is 34.13 kg/m.  Advanced Directives 04/08/2018 11/29/2015 02/22/2015 08/31/2014  Does Patient Have a Medical Advance Directive? Yes No Yes Yes  Type of Paramedic of [place];Living will - Living will;Healthcare Power of Attorney Living will;Healthcare Power of Attorney  Copy of [place] in Chart? Yes - validated most recent copy scanned in chart (See row information) - - -    Tobacco Social History   Tobacco Use  Smoking Status Never Smoker  Smokeless Tobacco Never Used     Counseling given: Not Answered   Clinical Intake:  Pre-visit preparation completed: Yes  Pain : No/denies pain Pain Score: 0-No pain(Has shoulder pain with movement. )    Diabetes:  Is the patient diabetic?  Yes type 2 If diabetic, was a CBG obtained today?  No  Did the patient bring in their glucometer from home?  No  How often do you monitor your CBG's? Once to twice weekly.   Financial Strains and Diabetes Management:  Are you having any financial strains with the device, your supplies or your medication? No .  Does the patient want to be seen by Chronic Care Management for management of their diabetes?  No  Would the patient like to be referred to a Nutritionist or for Diabetic Management?  No   Diabetic Exams:  Diabetic Eye Exam: Completed 02/12/17. Overdue for diabetic eye exam. Pt has been advised about the importance in completing this exam. Pt plans to set up an apt this year.    Diabetic Foot Exam: Completed 09/11/16. Pt has been advised about the importance in completing this exam. Note made to f/u on this at today's OV.   Nutritional Status: BMI > 30  Obese Nutritional Risks: None   How often do you need to have someone help you when you read instructions, pamphlets, or other written materials from your doctor or pharmacy?: 1 - Never  Interpreter Needed?: No  Information entered by :: [facility], LPN  Past Medical History:  Diagnosis Date  . Diabetes mellitus without complication ([place])   . Hyperlipidemia   . Hypertension    Past Surgical History:  Procedure Laterality Date  . HERNIA REPAIR  01/2013   Dr. Burt Knack  . LIPOMA EXCISION     Removed from back.  . 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 RENAL CYST EXCISION Right   . SKIN CANCER EXCISION    . UTERINE FIBROID SURGERY     Family History  Problem Relation Age of Onset  . Stroke Mother   . Prostate cancer Father   . Congestive Heart Failure Father   . Stroke Maternal Grandmother   . Stroke Maternal Grandfather   . Alzheimer's disease Maternal Grandfather   . Cancer Paternal Grandmother   . Cancer Paternal Grandfather   . Breast cancer Neg Hx    Social History   Socioeconomic History  . Marital status: Married  Spouse name: Balinda Quails  . Number of children: 1  . Years of education: 65  . Highest education level: Associate degree: occupational, Hotel manager, or vocational program  Occupational History    Employer: OTHER    Comment: Adult nurse  Social Needs  . Financial resource strain: Not hard at all  . Food insecurity:    Worry: Never true    Inability: Never true  . Transportation needs:    Medical: No    Non-medical: No  Tobacco Use  . Smoking status: Never Smoker  . Smokeless tobacco: Never Used  Substance and Sexual Activity  . Alcohol use: Yes    Comment: social  . Drug use: No  . Sexual activity: Not Currently  Lifestyle  . Physical  activity:    Days per week: 0 days    Minutes per session: 0 min  . Stress: Not at all  Relationships  . Social connections:    Talks on phone: Patient refused    Gets together: Patient refused    Attends religious service: Patient refused    Active member of club or organization: Patient refused    Attends meetings of clubs or organizations: Patient refused    Relationship status: Patient refused  Other Topics Concern  . Not on file  Social History Narrative  . Not on file    Outpatient Encounter Medications as of 04/08/2018  Medication Sig  . amLODipine (NORVASC) 10 MG tablet Take 1 tablet (10 mg total) by mouth daily.  . ASPIRIN ADULT LOW STRENGTH PO Take 81 mg by mouth daily.   Marland Kitchen atorvastatin (LIPITOR) 10 MG tablet Take 1 tablet (10 mg total) by mouth every evening.  . Coenzyme Q10 (COQ10) 200 MG CAPS Take 1 capsule by mouth daily. (Patient taking differently: Take 100 mg by mouth daily. )  . glucose blood test strip RELION CONFIRM/MICRO TEST (In Vitro Strip)  1 (one) Strip Strip daily for 0 days  Quantity: 100;  Refills: 3   Ordered :29-July-2012  [name] ;  Started 29-July-2012 Active  . lisinopril-hydrochlorothiazide (PRINZIDE,ZESTORETIC) 20-12.5 MG tablet Take 2 tablets by mouth every morning.  . metFORMIN (GLUCOPHAGE) 1000 MG tablet Take 1 tablet (1,000 mg total) by mouth 2 (two) times daily.  . Multiple Vitamins-Minerals (MULTIVITAMIN ADULTS PO) Take by mouth daily.   . Omega-3 Fatty Acids (FISH OIL) 1000 MG CAPS Take by mouth. Reported on 02/22/2015   No facility-administered encounter medications on file as of 04/08/2018.     Activities of Daily Living In your present state of health, do you have any difficulty performing the following activities: 04/08/2018  Hearing? N  Vision? N  Difficulty concentrating or making decisions? N  Walking or climbing stairs? N  Dressing or bathing? N  Doing errands, shopping? N  Preparing Food and eating ? N  Using the Toilet? N    In the past six months, have you accidently leaked urine? N  Do you have problems with loss of bowel control? N  Managing your Medications? N  Managing your Finances? N  Housekeeping or managing your Housekeeping? N  Some recent data might be hidden    Patient Care Team: Bacigalupo, Dionne Bucy, MD as PCP - General (Family Medicine) [place], [facility])    Assessment:   This is a routine wellness examination for Karen.  Exercise Activities and Dietary recommendations Current Exercise Habits: Home exercise routine, Type of exercise: walking, Time (Minutes): 15(to 30 minutes at a time), Frequency (Times/Week): 7,  Weekly Exercise (Minutes/Week): 105, Intensity: Mild  Goals    . Have 3 meals a day     Recommend to decrease portion sizes by eating 3 small healthy meals and at least 2 healthy snacks per day.       Fall Risk Fall Risk  04/08/2018 12/18/2016 11/29/2015 08/31/2014  Falls in the past year? 0 No No No   FALL RISK PREVENTION PERTAINING TO THE HOME: Any stairs in or around the home? Yes  If so, do they handrails? Yes   Home free of loose throw rugs in walkways, pet beds, electrical cords, etc? Yes  Adequate lighting in your home to reduce risk of falls? Yes   ASSISTIVE DEVICES UTILIZED TO PREVENT FALLS:  Life alert? No  Use of a cane, walker or w/c? No  Grab bars in the bathroom? No  Shower chair or bench in shower? No  Elevated toilet seat or a handicapped toilet? Yes    TIMED UP AND GO:  Was the test performed? No .    Depression Screen PHQ 2/9 Scores 04/08/2018 04/08/2018 12/18/2016 11/29/2015  PHQ - 2 Score 0 0 0 0  PHQ- 9 Score 0 - - -     Cognitive Function     6CIT Screen 04/08/2018  What Year? 0 points  What month? 0 points  What time? 0 points  Count back from 20 0 points  Months in reverse 0 points  Repeat phrase 0 points  Total Score 0    Immunization History  Administered Date(s) Administered  . Influenza Split 11/30/2005,  10/14/2009, 12/18/2011  . Influenza, High Dose Seasonal PF 11/30/2014, 11/29/2015, 11/02/2016, 11/29/2017  . Influenza,inj,Quad PF,6+ Mos 11/04/2012, 10/13/2013  . Pneumococcal Conjugate-13 01/05/2014  . Pneumococcal Polysaccharide-23 12/01/2004, 11/29/2015  . Td 12/26/1995, 12/18/2011  . Tdap 12/18/2011    Qualifies for Shingles Vaccine? Yes . Due for Shingrix. Education has been provided regarding the importance of this vaccine. Pt has been advised to call insurance company to determine out of pocket expense. Advised may also receive vaccine at local pharmacy or Health Dept. Verbalized acceptance and understanding.  Tdap: Up to date  Flu Vaccine: Up to date  Pneumococcal Vaccine: Up to date  Screening Tests Health Maintenance  Topic Date Due  . COLONOSCOPY  11/12/1998  . FOOT EXAM  09/11/2017  . HEMOGLOBIN A1C  12/19/2017  . OPHTHALMOLOGY EXAM  02/12/2018  . MAMMOGRAM  03/06/2019  . TETANUS/TDAP  12/17/2021  . DEXA SCAN  03/05/2022  . INFLUENZA VACCINE  Completed  . Hepatitis C Screening  Completed  . PNA vac Low Risk Adult  Completed    Cancer Screenings:  Colorectal Screening: Pt declined a referral or cologuard order today.   Mammogram: Completed 03/05/17.   Bone Density: Completed 03/05/17. Results reflect OSTEOPENIA. Repeat every 5 years.   Lung Cancer Screening: (Low Dose CT Chest recommended if Age 46-80 years, 30 pack-year currently smoking OR have quit w/in 15years.) does not qualify.    Additional Screening:  Hepatitis C Screening: Up to date  Vision Screening: Recommended annual ophthalmology exams for early detection of glaucoma and other disorders of the eye.  Dental Screening: Recommended annual dental exams for proper oral hygiene  Community Resource Referral:  CRR required this visit?  No      Plan:  I have personally reviewed and addressed the Medicare Annual Wellness questionnaire and have noted the following in the patient's chart:   A. Medical and social history B. Use of alcohol, tobacco or  illicit drugs  C. Current medications and supplements D. Functional ability and status E.  Nutritional status F.  Physical activity G. Advance directives H. List of other physicians I.  Hospitalizations, surgeries, and ER visits in previous 12 months J.  [place] such as hearing and vision if needed, cognitive and depression L. Referrals and appointments - none  In addition, I have reviewed and discussed with patient certain preventive protocols, quality metrics, and best practice recommendations. A written personalized care plan for preventive services as well as general preventive health recommendations were provided to patient.  See attached scanned questionnaire for additional information.   Signed,  Fabio Neighbors, LPN Nurse Health Advisor   Nurse Recommendations: Pt needs a diabetic foot exam and Hgb A1c checked today. Pt plans to set up an eye exam this year. Pt declined the colonoscopy referral and cologuard order.

## 2018-04-09 ENCOUNTER — Telehealth: Payer: Self-pay

## 2018-04-09 LAB — COMPREHENSIVE METABOLIC PANEL
ALT: 21 IU/L (ref 0–32)
AST: 20 IU/L (ref 0–40)
Albumin/Globulin Ratio: 2 (ref 1.2–2.2)
Albumin: 4.5 g/dL (ref 3.8–4.8)
Alkaline Phosphatase: 49 IU/L (ref 39–117)
BUN/Creatinine Ratio: 22 (ref 12–28)
BUN: 21 mg/dL (ref 8–27)
Bilirubin Total: 0.4 mg/dL (ref 0.0–1.2)
CO2: 23 mmol/L (ref 20–29)
Calcium: 9.9 mg/dL (ref 8.7–10.3)
Chloride: 99 mmol/L (ref 96–106)
Creatinine, Ser: 0.95 mg/dL (ref 0.57–1.00)
GFR calc Af Amer: 71 mL/min/{1.73_m2} (ref >59)
GFR calc non Af Amer: 61 mL/min/{1.73_m2} (ref >59)
GLOBULIN, TOTAL: 2.2 g/dL (ref 1.5–4.5)
Glucose: 139 mg/dL — ABNORMAL HIGH (ref 65–99)
Potassium: 3.7 mmol/L (ref 3.5–5.2)
Sodium: 141 mmol/L (ref 134–144)
Total Protein: 6.7 g/dL (ref 6.0–8.5)

## 2018-04-09 LAB — LIPID PANEL
Chol/HDL Ratio: 2.9 ratio (ref 0.0–4.4)
Cholesterol, Total: 161 mg/dL (ref 100–199)
HDL: 55 mg/dL (ref >39)
LDL Calculated: 79 mg/dL (ref 0–99)
Triglycerides: 136 mg/dL (ref 0–149)
VLDL Cholesterol Cal: 27 mg/dL (ref 5–40)

## 2018-04-09 LAB — HEMOGLOBIN A1C
Est. average glucose Bld gHb Est-mCnc: 154 mg/dL
HEMOGLOBIN A1C: 7 % — AB (ref 4.8–5.6)

## 2018-04-09 NOTE — Telephone Encounter (Signed)
-----   Message from Virginia Crews, MD sent at 04/09/2018  9:45 AM EST ----- A1c has elevated to 7.  Likely need to add second medicaiton for diabetes.  Would add Jardiance if ok with the patient.  Normal kidney function, liver function, electrolytes.  Cholesterol not quite to goal.  Recommend increasing Atorvastatin to 20mg  daily if taking this regularly.

## 2018-04-09 NOTE — Telephone Encounter (Signed)
Patient advised. She prefers to wait on medication changes until after the next lab check. She states she had multiple birthday parties ad has been eating bad. She is going to work on lifestyle changes.

## 2018-06-12 ENCOUNTER — Other Ambulatory Visit: Payer: Self-pay | Admitting: Family Medicine

## 2018-06-12 DIAGNOSIS — I10 Essential (primary) hypertension: Secondary | ICD-10-CM

## 2018-07-07 ENCOUNTER — Other Ambulatory Visit: Payer: Self-pay | Admitting: Family Medicine

## 2018-07-07 DIAGNOSIS — E7800 Pure hypercholesterolemia, unspecified: Secondary | ICD-10-CM

## 2018-07-29 ENCOUNTER — Other Ambulatory Visit: Payer: Self-pay | Admitting: Family Medicine

## 2018-07-29 ENCOUNTER — Other Ambulatory Visit: Payer: Self-pay

## 2018-07-29 DIAGNOSIS — E119 Type 2 diabetes mellitus without complications: Secondary | ICD-10-CM

## 2018-07-29 MED ORDER — METFORMIN HCL 1000 MG PO TABS: 1000.0000 mg | ORAL_TABLET | Freq: Two times a day (BID) | ORAL | 2 refills | Status: DC

## 2018-09-08 ENCOUNTER — Other Ambulatory Visit: Payer: Self-pay | Admitting: Family Medicine

## 2018-09-08 DIAGNOSIS — I10 Essential (primary) hypertension: Secondary | ICD-10-CM

## 2018-10-13 NOTE — Progress Notes (Signed)
[name] Female    DOB: [birth year]   68 y.o.   MRN: [id number] Visit Date: 10/14/2018  Today's Provider: Lavon Paganini, MD   Chief Complaint  Patient presents with  . Diabetes  . Hypertension  . Hyperlipidemia   Subjective:    HPI  Diabetes Mellitus Type II, Follow-up:   Lab Results  Component Value Date   HGBA1C 6.8 (A) 10/14/2018   HGBA1C 7.0 (H) 04/08/2018   HGBA1C 6.6 06/18/2017   Last seen for diabetes 6 months ago.  Management since then includes none. She reports good compliance with treatment. She is not having side effects.  Current symptoms include none and have been stable. Home blood sugar records: 100-150  Episodes of hypoglycemia? no   Current Insulin Regimen: n/a Most Recent Eye Exam: 2019 - delayed due to [place] pandemic Weight trend: stable Prior visit with dietician: no Current diet: well balanced Current exercise: walking  ------------------------------------------------------------------------   Hypertension, follow-up:  BP Readings from Last 3 Encounters:  10/14/18 130/68  04/08/18 137/82  04/08/18 (!) 144/68    She was last seen for hypertension 6 months ago.  BP at that visit was:144/68 & 137/82. Management since that visit includes none.She reports good compliance with treatment. She is not having side effects.  She is exercising. She is adherent to low salt diet.   Outside blood pressures are not checked. She is experiencing palpitations.  Patient denies chest pain, chest pressure/discomfort, exertional chest pressure/discomfort, fatigue, irregular heart beat and orthopnea.   Cardiovascular risk factors include advanced age (older than 67 for men, 19 for women), diabetes mellitus and hypertension.  Use of agents associated with hypertension: none.   ------------------------------------------------------------------------    Lipid/Cholesterol, Follow-up:   Last seen for this 6 months ago.  Management  since that visit includes none.  Last Lipid Panel:    Component Value Date/Time   CHOL 161 04/08/2018 1120   TRIG 136 04/08/2018 1120   HDL 55 04/08/2018 1120   CHOLHDL 2.9 04/08/2018 1120   CHOLHDL 2.9 12/18/2016 1112   LDLCALC 79 04/08/2018 1120   LDLCALC CANCELED 12/18/2016 1207    She reports good compliance with treatment. She is not having side effects.   Wt Readings from Last 3 Encounters:  10/14/18 176 lb 6.4 oz (80 kg)  04/08/18 186 lb 9.6 oz (84.6 kg)  04/08/18 186 lb 9.6 oz (84.6 kg)    ------------------------------------------------------------------------ Had trouble with Cologuard - neighbors picking it up.  Was given stool cards at last visit - will return these.  Allergies  Allergen Reactions  . Phosphate Laxative  [Sodium Phosphate]     Phosphosoda- Bad stomach cramps  . Ceclor  [Cefaclor] Rash  . Penicillins Rash     Current Outpatient Medications:  .  acetaminophen (TYLENOL) 500 MG tablet, Take 500 mg by mouth every 6 (six) hours as needed., Disp: , Rfl:  .  amLODipine (NORVASC) 10 MG tablet, TAKE ONE TABLET BY MOUTH EVERY DAY, Disp: 90 tablet, Rfl: 3 .  ASPIRIN ADULT LOW STRENGTH PO, Take 81 mg by mouth daily. , Disp: , Rfl:  .  atorvastatin (LIPITOR) 10 MG tablet, TAKE ONE TABLET BY MOUTH EVERY EVENING, Disp: 90 tablet, Rfl: 3 .  Coenzyme Q10 (COQ10) 200 MG CAPS, Take 1 capsule by mouth daily. (Patient taking differently: Take 100 mg by mouth daily. ), Disp: 30 capsule, Rfl: 0 .  glucose blood test strip, RELION CONFIRM/MICRO TEST (In Vitro Strip)  1 (one) Strip  Strip daily for 0 days  Quantity: 100;  Refills: 3   Ordered :29-July-2012  [name] ;  Started 29-July-2012 Active, Disp: , Rfl:  .  lisinopril-hydrochlorothiazide (ZESTORETIC) 20-12.5 MG tablet, TAKE TWO TABLETS BY MOUTH EVERY DAY, Disp: 180 tablet, Rfl: 3 .  metFORMIN (GLUCOPHAGE) 1000 MG tablet, Take 1 tablet (1,000 mg total) by mouth 2 (two) times daily., Disp: 180 tablet, Rfl: 2 .   Multiple Vitamins-Minerals (MULTIVITAMIN ADULTS PO), Take by mouth daily. , Disp: , Rfl:  .  Omega-3 Fatty Acids (FISH OIL) 1000 MG CAPS, Take by mouth. Reported on 02/22/2015, Disp: , Rfl:   Review of Systems  Constitutional: Negative.   Respiratory: Negative.   Cardiovascular: Negative.   Musculoskeletal: Negative.   Neurological: Negative.     Social History   Tobacco Use  . Smoking status: Never Smoker  . Smokeless tobacco: Never Used  Substance Use Topics  . Alcohol use: Yes    Comment: social      Objective:   BP 130/68 (BP Location: Left Arm, Patient Position: Sitting, Cuff Size: Normal)   Pulse 73   Temp (!) 96.6 F (35.9 C) (Temporal)   Wt 176 lb 6.4 oz (80 kg)   SpO2 99%   BMI 32.26 kg/m  Vitals:   10/14/18 0811  BP: 130/68  Pulse: 73  Temp: (!) 96.6 F (35.9 C)  TempSrc: Temporal  SpO2: 99%  Weight: 176 lb 6.4 oz (80 kg)  Body mass index is 32.26 kg/m.   Physical Exam Vitals signs reviewed.  Constitutional:      General: She is not in acute distress.    Appearance: Normal appearance. She is well-developed. She is not diaphoretic.  HENT:     Head: Normocephalic and atraumatic.  Eyes:     General: No scleral icterus.    Conjunctiva/sclera: Conjunctivae normal.  Neck:     Musculoskeletal: Neck supple.     Thyroid: No thyromegaly.  Cardiovascular:     Rate and Rhythm: Normal rate and regular rhythm.     Pulses: Normal pulses.     Heart sounds: Normal heart sounds. No murmur.  Pulmonary:     Effort: Pulmonary effort is normal. No respiratory distress.     Breath sounds: Normal breath sounds. No wheezing, rhonchi or rales.  Musculoskeletal:     Right lower leg: No edema.     Left lower leg: No edema.  Lymphadenopathy:     Cervical: No cervical adenopathy.  Skin:    General: Skin is warm and dry.     Capillary Refill: Capillary refill takes less than 2 seconds.     Findings: No rash.  Neurological:     Mental Status: She is alert and  oriented to person, place, and time. Mental status is at baseline.  Psychiatric:        Mood and Affect: Mood normal.        Behavior: Behavior normal.      Results for orders placed or performed in visit on 10/14/18  POCT glycosylated hemoglobin (Hb A1C)  Result Value Ref Range   Hemoglobin A1C 6.8 (A) 4.0 - 5.6 %   HbA1c POC (<> result, manual entry)     HbA1c, POC (prediabetic range)     HbA1c, POC (controlled diabetic range)     Est. average glucose Bld gHb Est-mCnc 148        Assessment & Plan   Problem List Items Addressed This Visit      Cardiovascular and Mediastinum  Essential (primary) hypertension    Well controlled Continue current medications Recheck metabolic panel F/u in 6 months       Relevant Orders   Comprehensive metabolic panel     Endocrine   Diabetes mellitus, type 2 ([place]) - Primary    Well controlled Continue current medications UTD on vaccines, foot exam Advised to schedule eye exam On ACEi On Statin Discussed diet and exercise F/u in 6 months       Relevant Orders   POCT glycosylated hemoglobin (Hb A1C) (Completed)   Hyperlipidemia associated with type 2 diabetes mellitus (HCC)    Continue atorvastatin at current dose Recheck FLP and CMP      Relevant Orders   Lipid panel   Comprehensive metabolic panel     Nervous and Auditory   Diabetic neuropathy (HCC)    Chronic, mild Not on medications        Genitourinary   Angiomyolipoma of right kidney    Previously followed by Urology Repeat Renal US to ensure stability Continue to monitor renal function (s/p L nephrectomy)      Relevant Orders   US Renal     Other   Obesity    Discussed importance of healthy weight management Discussed diet and exercise        Other Visit Diagnoses    Need for influenza vaccination       Relevant Orders   Flu Vaccine QUAD High Dose(Fluad) (Completed)   Screening for breast cancer       Relevant Orders   MM 3D SCREEN BREAST  BILATERAL       Return in about 6 months (around 04/13/2019) for CPE/AWV, as scheduled.   The entirety of the information documented in the History of Present Illness, Review of Systems and Physical Exam were personally obtained by me. Portions of this information were initially documented by [facility], CMA and reviewed by me for thoroughness and accuracy.    Arieon Scalzo, Dionne Bucy, MD MPH [facility]

## 2018-10-14 ENCOUNTER — Ambulatory Visit (INDEPENDENT_AMBULATORY_CARE_PROVIDER_SITE_OTHER): Payer: PPO | Admitting: Family Medicine

## 2018-10-14 ENCOUNTER — Other Ambulatory Visit: Payer: Self-pay

## 2018-10-14 ENCOUNTER — Encounter: Payer: Self-pay | Admitting: Family Medicine

## 2018-10-14 VITALS — BP 130/68 | HR 73 | Temp 96.6°F | Wt 176.4 lb

## 2018-10-14 DIAGNOSIS — Z23 Encounter for immunization: Secondary | ICD-10-CM | POA: Diagnosis not present

## 2018-10-14 DIAGNOSIS — Z6832 Body mass index (BMI) 32.0-32.9, adult: Secondary | ICD-10-CM | POA: Diagnosis not present

## 2018-10-14 DIAGNOSIS — Z1239 Encounter for other screening for malignant neoplasm of breast: Secondary | ICD-10-CM | POA: Diagnosis not present

## 2018-10-14 DIAGNOSIS — E1169 Type 2 diabetes mellitus with other specified complication: Secondary | ICD-10-CM | POA: Diagnosis not present

## 2018-10-14 DIAGNOSIS — D1771 Benign lipomatous neoplasm of kidney: Secondary | ICD-10-CM

## 2018-10-14 DIAGNOSIS — E785 Hyperlipidemia, unspecified: Secondary | ICD-10-CM | POA: Diagnosis not present

## 2018-10-14 DIAGNOSIS — E1142 Type 2 diabetes mellitus with diabetic polyneuropathy: Secondary | ICD-10-CM | POA: Diagnosis not present

## 2018-10-14 DIAGNOSIS — E669 Obesity, unspecified: Secondary | ICD-10-CM | POA: Diagnosis not present

## 2018-10-14 DIAGNOSIS — I10 Essential (primary) hypertension: Secondary | ICD-10-CM

## 2018-10-14 DIAGNOSIS — D3001 Benign neoplasm of right kidney: Secondary | ICD-10-CM | POA: Diagnosis not present

## 2018-10-14 LAB — POCT GLYCOSYLATED HEMOGLOBIN (HGB A1C)
Est. average glucose Bld gHb Est-mCnc: 148
Hemoglobin A1C: 6.8 % — AB (ref 4.0–5.6)

## 2018-10-14 NOTE — Assessment & Plan Note (Signed)
Chronic, mild Not on medications

## 2018-10-14 NOTE — Patient Instructions (Signed)
Due for mammogram and eye exam Reminder to do stool cards

## 2018-10-14 NOTE — Assessment & Plan Note (Signed)
Well controlled Continue current medications Recheck metabolic panel F/u in 6 months  

## 2018-10-14 NOTE — Assessment & Plan Note (Signed)
Discussed importance of healthy weight management Discussed diet and exercise  

## 2018-10-14 NOTE — Assessment & Plan Note (Signed)
Previously followed by Urology Repeat Renal US to ensure stability Continue to monitor renal function (s/p L nephrectomy)

## 2018-10-14 NOTE — Assessment & Plan Note (Signed)
Well controlled Continue current medications UTD on vaccines, foot exam Advised to schedule eye exam On ACEi On Statin Discussed diet and exercise F/u in 6 months

## 2018-10-14 NOTE — Assessment & Plan Note (Signed)
Continue atorvastatin at current dose Recheck FLP and CMP

## 2018-10-15 LAB — COMPREHENSIVE METABOLIC PANEL
ALT: 20 IU/L (ref 0–32)
AST: 18 IU/L (ref 0–40)
Albumin/Globulin Ratio: 2.1 (ref 1.2–2.2)
Albumin: 4.6 g/dL (ref 3.8–4.8)
Alkaline Phosphatase: 45 IU/L (ref 39–117)
BUN/Creatinine Ratio: 20 (ref 12–28)
BUN: 24 mg/dL (ref 8–27)
Bilirubin Total: 0.4 mg/dL (ref 0.0–1.2)
CO2: 24 mmol/L (ref 20–29)
Calcium: 10 mg/dL (ref 8.7–10.3)
Chloride: 99 mmol/L (ref 96–106)
Creatinine, Ser: 1.2 mg/dL — ABNORMAL HIGH (ref 0.57–1.00)
GFR calc Af Amer: 53 mL/min/{1.73_m2} — ABNORMAL LOW (ref >59)
GFR calc non Af Amer: 46 mL/min/{1.73_m2} — ABNORMAL LOW (ref >59)
Globulin, Total: 2.2 g/dL (ref 1.5–4.5)
Glucose: 152 mg/dL — ABNORMAL HIGH (ref 65–99)
Potassium: 3.9 mmol/L (ref 3.5–5.2)
Sodium: 139 mmol/L (ref 134–144)
Total Protein: 6.8 g/dL (ref 6.0–8.5)

## 2018-10-15 LAB — LIPID PANEL
Chol/HDL Ratio: 3.4 ratio (ref 0.0–4.4)
Cholesterol, Total: 155 mg/dL (ref 100–199)
HDL: 46 mg/dL (ref >39)
LDL Chol Calc (NIH): 83 mg/dL (ref 0–99)
Triglycerides: 152 mg/dL — ABNORMAL HIGH (ref 0–149)
VLDL Cholesterol Cal: 26 mg/dL (ref 5–40)

## 2018-10-16 ENCOUNTER — Telehealth: Payer: Self-pay

## 2018-10-16 NOTE — Telephone Encounter (Signed)
LMTCB 10/16/2018  Thanks,   -Sindhu Nguyen  

## 2018-10-16 NOTE — Telephone Encounter (Signed)
-----   Message from Virginia Crews, MD sent at 10/15/2018  4:50 PM EDT ----- Cholesterol is not quite to goal in the setting of diabetes.  Recommend increasing atorvastatin dose to 20 mg daily.  Okay to send in a new prescription with the new dose for 90-day supply with 1 refill  Kidney function is worse than it was 6 months ago.  I recommend hydrating and avoiding NSAIDs.  Recommend repeating renal function panel in 2 to 3 weeks

## 2018-10-16 NOTE — Telephone Encounter (Signed)
Patient notified of lab results

## 2018-10-21 ENCOUNTER — Ambulatory Visit
Admission: RE | Admit: 2018-10-21 | Discharge: 2018-10-21 | Disposition: A | Discharge status: Home / Self Care | Payer: PPO | Source: Ambulatory Visit | Attending: Family Medicine | Admitting: Family Medicine

## 2018-10-21 ENCOUNTER — Other Ambulatory Visit: Payer: Self-pay

## 2018-10-21 DIAGNOSIS — N281 Cyst of kidney, acquired: Secondary | ICD-10-CM | POA: Diagnosis not present

## 2018-10-21 DIAGNOSIS — D3001 Benign neoplasm of right kidney: Secondary | ICD-10-CM | POA: Insufficient documentation

## 2018-10-21 DIAGNOSIS — D1771 Benign lipomatous neoplasm of kidney: Secondary | ICD-10-CM

## 2018-10-22 ENCOUNTER — Telehealth: Payer: Self-pay

## 2018-10-22 NOTE — Telephone Encounter (Signed)
Patient advised.

## 2018-10-22 NOTE — Telephone Encounter (Signed)
-----   Message from Virginia Crews, MD sent at 10/22/2018 10:32 AM EDT ----- Renal Mass is stable

## 2018-10-22 NOTE — Telephone Encounter (Signed)
LMTCB

## 2018-11-04 ENCOUNTER — Other Ambulatory Visit: Payer: Self-pay | Admitting: Family Medicine

## 2018-11-04 ENCOUNTER — Telehealth: Payer: Self-pay

## 2018-11-04 DIAGNOSIS — D3001 Benign neoplasm of right kidney: Secondary | ICD-10-CM | POA: Diagnosis not present

## 2018-11-04 DIAGNOSIS — D1771 Benign lipomatous neoplasm of kidney: Secondary | ICD-10-CM

## 2018-11-04 DIAGNOSIS — Z1211 Encounter for screening for malignant neoplasm of colon: Secondary | ICD-10-CM

## 2018-11-04 LAB — POC HEMOCCULT BLD/STL (HOME/3-CARD/SCREEN)
Card #2 Fecal Occult Blod, POC: NEGATIVE
Card #3 Fecal Occult Blood, POC: NEGATIVE
Fecal Occult Blood, POC: NEGATIVE

## 2018-11-04 LAB — IFOBT (OCCULT BLOOD): IFOBT: NEGATIVE

## 2018-11-04 NOTE — Telephone Encounter (Signed)
Patient was advised from last labs to come have renal function panel redrawn.

## 2018-11-05 ENCOUNTER — Telehealth: Payer: Self-pay

## 2018-11-05 LAB — RENAL FUNCTION PANEL
Albumin: 4.5 g/dL (ref 3.8–4.8)
BUN/Creatinine Ratio: 17 (ref 12–28)
BUN: 16 mg/dL (ref 8–27)
CO2: 25 mmol/L (ref 20–29)
Calcium: 9.9 mg/dL (ref 8.7–10.3)
Chloride: 99 mmol/L (ref 96–106)
Creatinine, Ser: 0.96 mg/dL (ref 0.57–1.00)
GFR calc Af Amer: 70 mL/min/{1.73_m2} (ref >59)
GFR calc non Af Amer: 61 mL/min/{1.73_m2} (ref >59)
Glucose: 151 mg/dL — ABNORMAL HIGH (ref 65–99)
Phosphorus: 3.7 mg/dL (ref 3.0–4.3)
Potassium: 3.7 mmol/L (ref 3.5–5.2)
Sodium: 141 mmol/L (ref 134–144)

## 2018-11-05 NOTE — Telephone Encounter (Signed)
-----   Message from Virginia Crews, MD sent at 11/04/2018  2:43 PM EDT ----- Negative stool cards. Repeat this or another colon cancer screening in 1 year.

## 2018-11-05 NOTE — Telephone Encounter (Signed)
Patient advised.

## 2018-11-07 IMAGING — US US RENAL
1 series · 14 of 25 positions shown · non-contrast
Comparison: 06/28/2015, 02/17/2008

CLINICAL DATA: Follow-up angiomyolipoma.

EXAM:
RENAL / URINARY TRACT ULTRASOUND COMPLETE

[Series 1: us renal · 0.23mm/px · 14 of 26 slices shown]
[im 1/26]
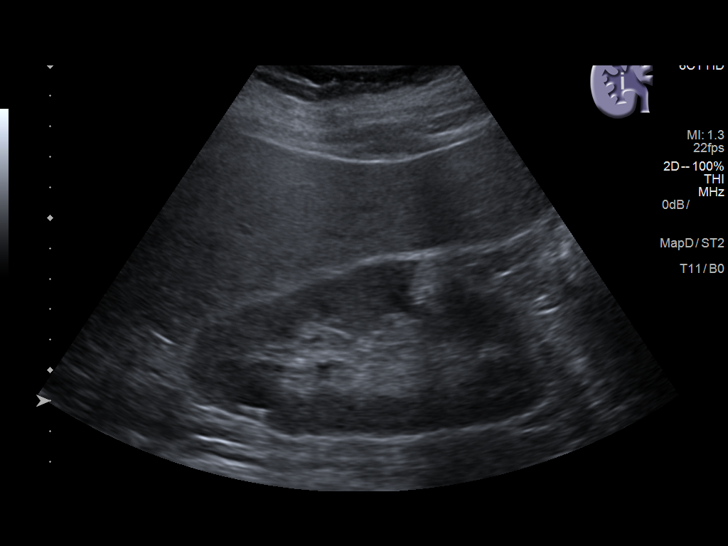
[im 3/26]
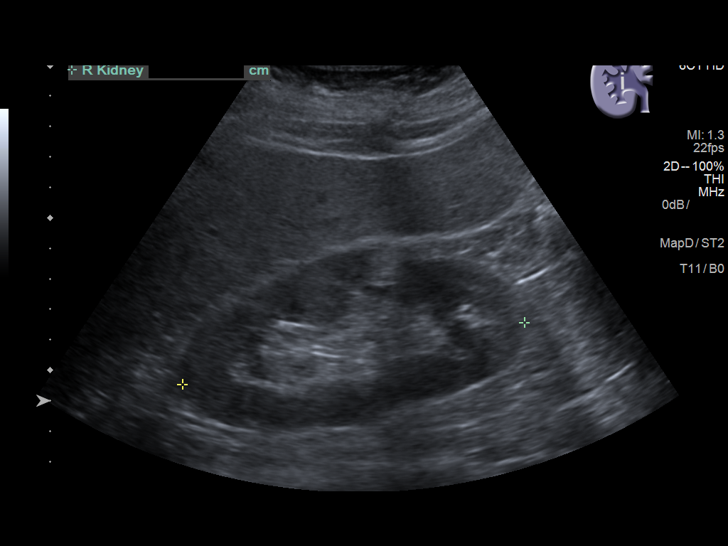
[im 5/26]
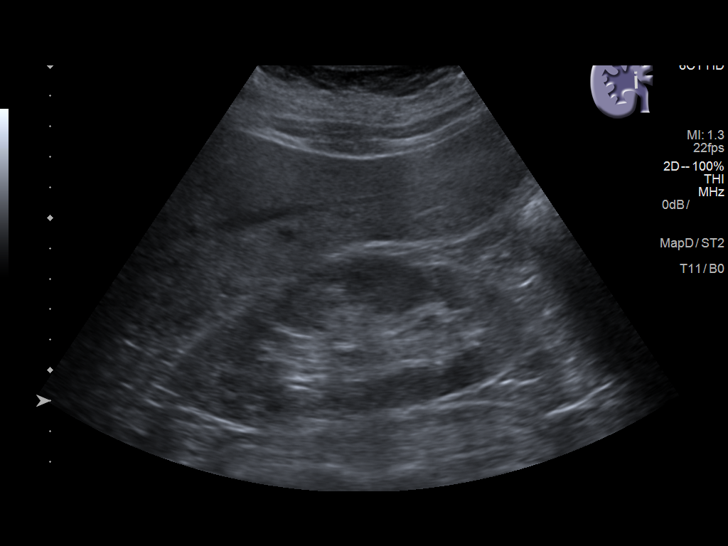
[im 7/26]
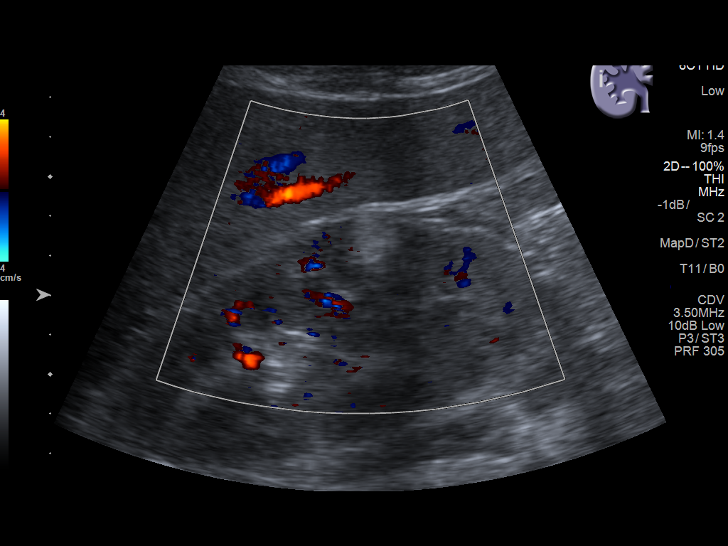
[im 9/26]
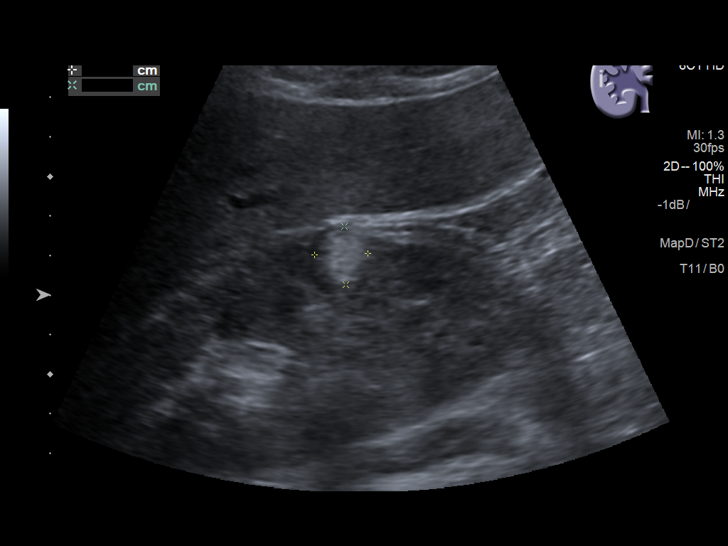
[im 10/26]
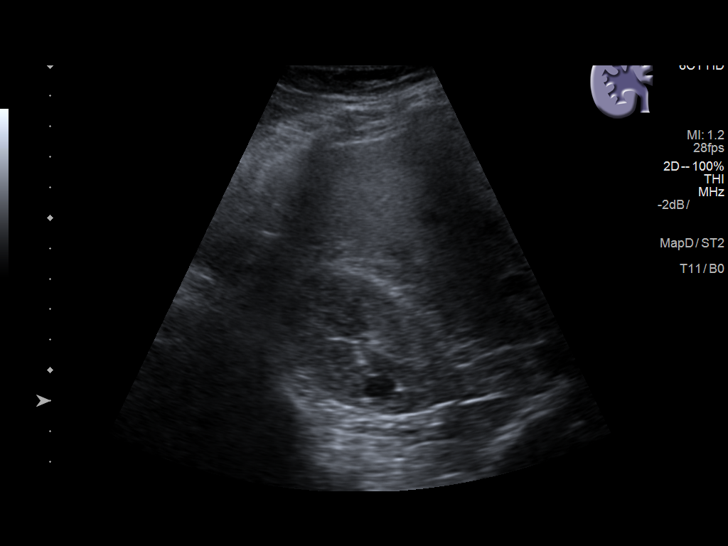
[im 12/26]
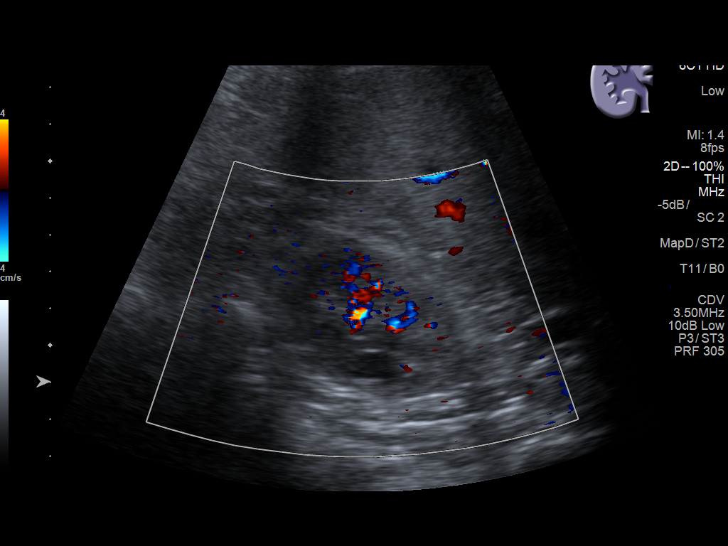
[im 14/26]
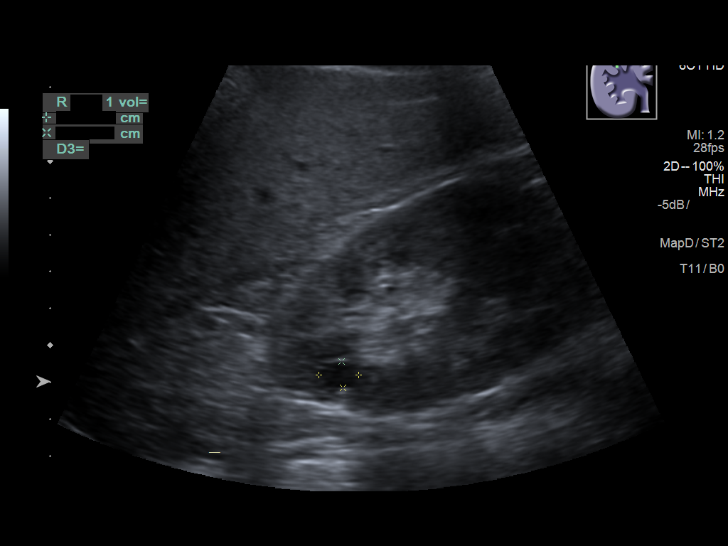
[im 16/26]
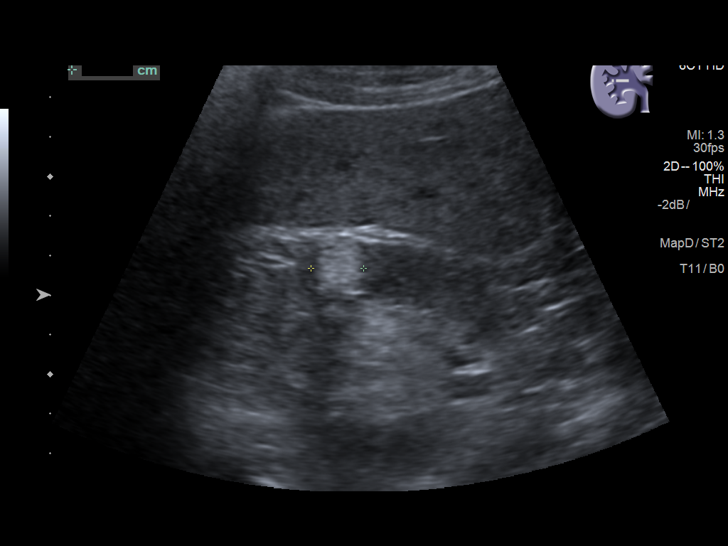
[im 17/26]
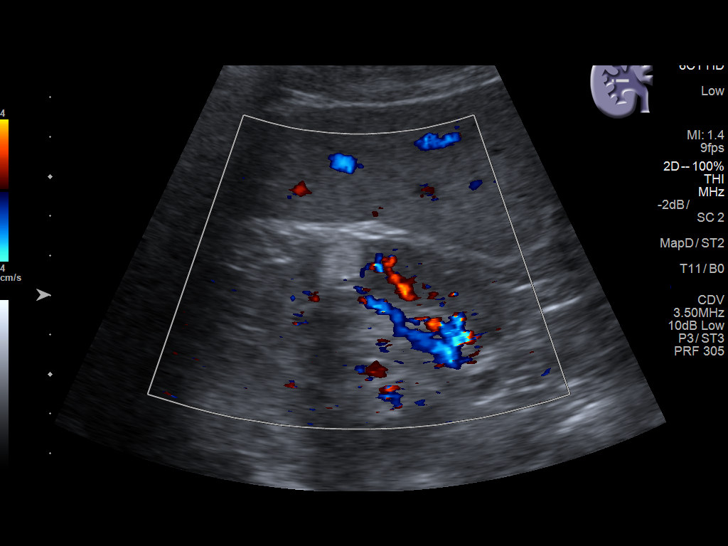
[im 19/26]
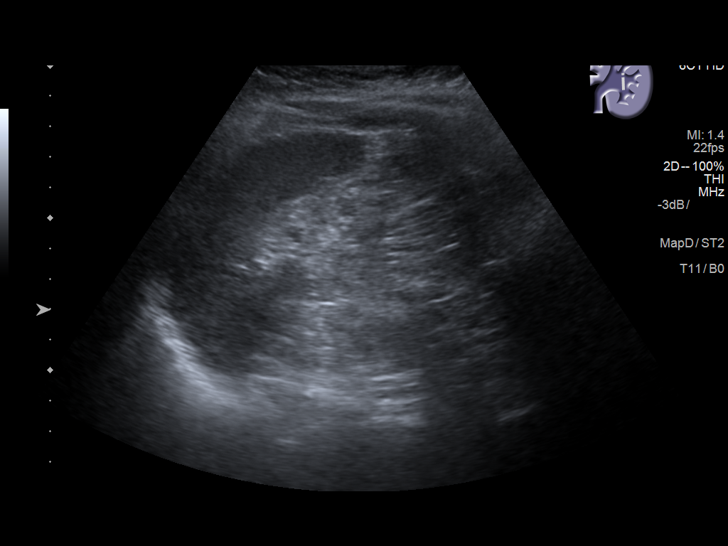
[im 21/26]
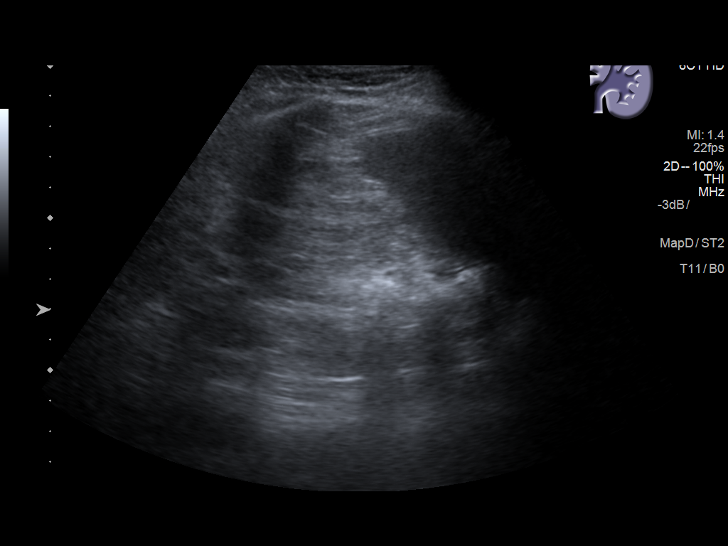
[im 23/26]
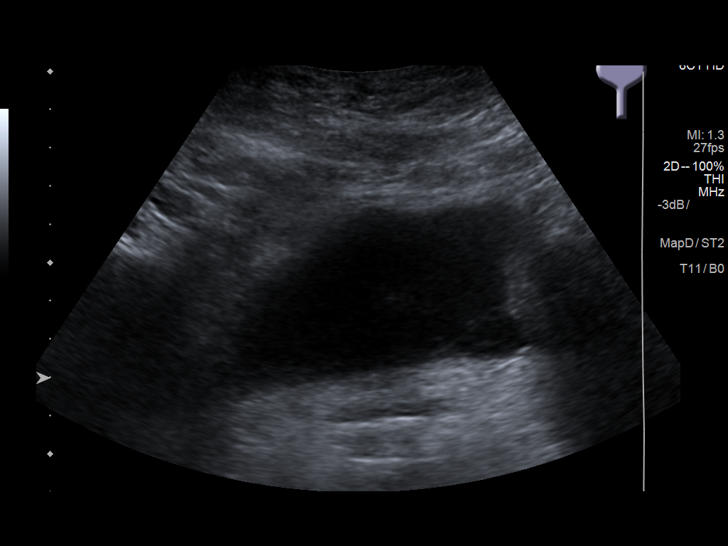
[im 26/26]
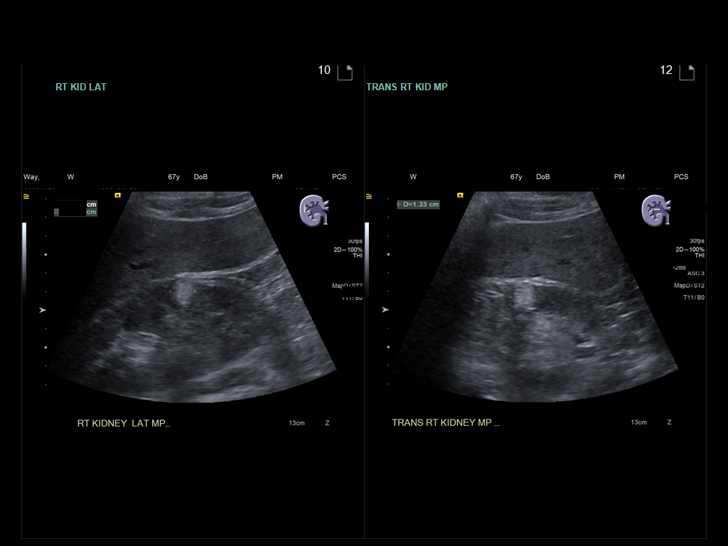

[14 of 25 positions shown; findings below may reference images not displayed]

FINDINGS: Right Kidney:

Length: 11.4 cm . Normal echogenicity is noted. Scattered small
cysts are noted similar to that seen on prior CT examinations. A a
focal area of increased echogenicity is noted in the mid to lower
pole of the right kidney similar to that seen on prior exam
consistent with angiomyolipoma. It has increased slightly in the
interval from 8666 now measuring 1.5 cm (previous measurement 9 mm).
No hydronephrosis is noted.

Left Kidney:

Surgically removed

Bladder:

Appears normal for degree of bladder distention.
IMPRESSION: Minimal enlargement of right angiomyolipoma.  9-15 mm over 8 years.

Right renal cysts.

Left nephrectomy.

## 2018-11-25 ENCOUNTER — Telehealth: Payer: Self-pay | Admitting: Family Medicine

## 2018-11-25 MED ORDER — ATORVASTATIN CALCIUM 20 MG PO TABS: 20.0000 mg | ORAL_TABLET | Freq: Every day | ORAL | 1 refills | Status: DC

## 2018-11-25 NOTE — Telephone Encounter (Signed)
RX sent to [facility] pharmacy. Attempted to contact patient, no answer. Left a voicemail advising patient.

## 2018-11-25 NOTE — Telephone Encounter (Signed)
Pt's atorvastatin was increased to 20 mg.  It's not ready for pick up at the pharmacy. She is needing it filled today because she is out.    Please fill at:  [place], [place] - [place] [phone] (Phone) [phone] (Fax)   Thanks, [facility]

## 2019-01-25 ENCOUNTER — Other Ambulatory Visit: Payer: Self-pay | Admitting: Family Medicine

## 2019-01-25 DIAGNOSIS — E119 Type 2 diabetes mellitus without complications: Secondary | ICD-10-CM

## 2019-03-15 ENCOUNTER — Ambulatory Visit: Payer: PPO | Attending: Internal Medicine

## 2019-03-15 DIAGNOSIS — Z23 Encounter for immunization: Secondary | ICD-10-CM | POA: Insufficient documentation

## 2019-03-15 NOTE — Progress Notes (Signed)
   Covid-19 Vaccination Clinic  Name:  [place] W Level    MRN: [id number] DOB: [birth year]  03/15/2019  Ms. Lorton was observed post Covid-19 immunization for 15 minutes without incidence. She was provided with Vaccine Information Sheet and instruction to access the V-Safe system.   Ms. Schone was instructed to call 911 with any severe reactions post vaccine: Marland Kitchen Difficulty breathing  . Swelling of your face and throat  . A fast heartbeat  . A bad rash all over your body  . Dizziness and weakness    Immunizations Administered    Name Date Dose VIS Date Route   Pfizer COVID-19 Vaccine 03/15/2019 11:20 AM 0.3 mL 01/15/2019 Intramuscular   Manufacturer: [place]   Lot: [id number]   [place]: [id number]

## 2019-03-17 ENCOUNTER — Other Ambulatory Visit: Payer: Self-pay | Admitting: Family Medicine

## 2019-03-17 DIAGNOSIS — I10 Essential (primary) hypertension: Secondary | ICD-10-CM

## 2019-03-17 NOTE — Telephone Encounter (Signed)
Upcoming appointment- refilled per protocol

## 2019-04-08 ENCOUNTER — Ambulatory Visit: Payer: PPO | Attending: Internal Medicine

## 2019-04-08 DIAGNOSIS — Z23 Encounter for immunization: Secondary | ICD-10-CM | POA: Insufficient documentation

## 2019-04-08 NOTE — Progress Notes (Signed)
   Covid-19 Vaccination Clinic  Name:  [place] W Lamour    MRN: [id number] DOB: [birth year]  04/08/2019  Ms. Frontera was observed post Covid-19 immunization for 15 minutes without incident. She was provided with Vaccine Information Sheet and instruction to access the V-Safe system.   Ms. Michelson was instructed to call 911 with any severe reactions post vaccine: Marland Kitchen Difficulty breathing  . Swelling of face and throat  . A fast heartbeat  . A bad rash all over body  . Dizziness and weakness   Immunizations Administered    Name Date Dose VIS Date Route   Pfizer COVID-19 Vaccine 04/08/2019  6:30 PM 0.3 mL 01/15/2019 Intramuscular   Manufacturer: [place]   Lot: [id number]   [place]: [id number]

## 2019-04-12 NOTE — Progress Notes (Signed)
Subjective:   Karen Olsen is a 69 y.o. female who presents for Medicare Annual (Subsequent) preventive examination.    This visit is being conducted through telemedicine due to the COVID-19 pandemic. This patient has given me verbal consent via doximity to conduct this visit, patient states they are participating from their home address. Some vital signs may be absent or patient reported.    Patient identification: identified by name, DOB, and current address  Review of Systems:  N/A  Cardiac Risk Factors include: advanced age (>22men, >51 women);diabetes mellitus;dyslipidemia;hypertension     Objective:     Vitals: There were no vitals taken for this visit.  There is no height or weight on file to calculate BMI. Unable to obtain vitals due to visit being conducted via telephonically.   Advanced Directives 04/13/2019 04/08/2018 11/29/2015 02/22/2015 08/31/2014  Does Patient Have a Medical Advance Directive? Yes Yes No Yes Yes  Type of Paramedic of [place];Living will [address];Living will - Living will;Healthcare Power of Attorney Living will;Healthcare Power of Attorney  Copy of [place] in Chart? No - copy requested Yes - validated most recent copy scanned in chart (See row information) - - -    Tobacco Social History   Tobacco Use  Smoking Status Never Smoker  Smokeless Tobacco Never Used     Counseling given: Not Answered   Clinical Intake:  Pre-visit preparation completed: Yes  Pain : No/denies pain Pain Score: 0-No pain     Nutritional Risks: None Diabetes: Yes  How often do you need to have someone help you when you read instructions, pamphlets, or other written materials from your doctor or pharmacy?: 1 - Never   Diabetes:  Is the patient diabetic?  Yes  If diabetic, was a CBG obtained today?  No  Did the patient bring in their glucometer from home?  No  How often do you monitor your  CBG's? Depends on how patient is feeling. Ranges from none to a couple times daily.   Financial Strains and Diabetes Management:  Are you having any financial strains with the device, your supplies or your medication? No .  Does the patient want to be seen by Chronic Care Management for management of their diabetes?  No  Would the patient like to be referred to a Nutritionist or for Diabetic Management?  No   Diabetic Exams:  Diabetic Eye Exam: Completed 02/12/17. Overdue for diabetic eye exam. Pt has been advised about the importance in completing this exam. Pt plans to set up an eye exam this year.   Diabetic Foot Exam: Completed 04/08/18. Pt has been advised about the importance in completing this exam. Note made to follow up on this at next in office apt.     Interpreter Needed?: No  Information entered by :: [facility], LPN  Past Medical History:  Diagnosis Date  . Diabetes mellitus without complication ([place])   . Hyperlipidemia   . Hypertension    Past Surgical History:  Procedure Laterality Date  . HERNIA REPAIR  01/2013   Dr. Burt Knack  . LIPOMA EXCISION     Removed from back.  . 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 RENAL CYST EXCISION Right   . SKIN CANCER EXCISION    . UTERINE FIBROID SURGERY     Family History  Problem Relation Age of Onset  . Stroke Mother   . Prostate cancer Father   . Congestive Heart  Failure Father   . Stroke Maternal Grandmother   . Stroke Maternal Grandfather   . Alzheimer's disease Maternal Grandfather   . Cancer Paternal Grandmother   . Cancer Paternal Grandfather   . Breast cancer Neg Hx    Social History   Socioeconomic History  . Marital status: Married    Spouse name: Balinda Quails  . Number of children: 1  . Years of education: 64  . Highest education level: Associate degree: occupational, Hotel manager, or vocational program  Occupational History    Employer: OTHER    Comment: Adult nurse  Tobacco Use  .  Smoking status: Never Smoker  . Smokeless tobacco: Never Used  Substance and Sexual Activity  . Alcohol use: Not Currently  . Drug use: No  . Sexual activity: Not Currently  Other Topics Concern  . Not on file  Social History Narrative  . Not on file   Social Determinants of Health   Financial Resource Strain: Low Risk   . Difficulty of Paying Living Expenses: Not hard at all  Food Insecurity: No Food Insecurity  . Worried About Charity fundraiser in the Last Year: Never true  . Ran Out of Food in the Last Year: Never true  Transportation Needs: No Transportation Needs  . Lack of Transportation (Medical): No  . Lack of Transportation (Non-Medical): No  Physical Activity: Insufficiently Active  . Days of Exercise per Week: 4 days  . Minutes of Exercise per Session: 30 min  Stress: No Stress Concern Present  . Feeling of Stress : Only a little  Social Connections: Slightly Isolated  . Frequency of Communication with Friends and Family: More than three times a week  . Frequency of Social Gatherings with Friends and Family: More than three times a week  . Attends Religious Services: More than 4 times per year  . Active Member of Clubs or Organizations: No  . Attends Archivist Meetings: Never  . Marital Status: Married    Outpatient Encounter Medications as of 04/13/2019  Medication Sig  . acetaminophen (TYLENOL) 500 MG tablet Take 500 mg by mouth every 6 (six) hours as needed.  Marland Kitchen amLODipine (NORVASC) 10 MG tablet TAKE ONE TABLET BY MOUTH EVERY DAY  . atorvastatin (LIPITOR) 20 MG tablet Take 1 tablet (20 mg total) by mouth daily.  . Coenzyme Q10 (COQ10) 200 MG CAPS Take 1 capsule by mouth daily. (Patient taking differently: Take 100 mg by mouth daily. )  . glucose blood test strip RELION CONFIRM/MICRO TEST (In Vitro Strip)  1 (one) Strip Strip daily for 0 days  Quantity: 100;  Refills: 3   Ordered :29-July-2012  [name] ;  Started 29-July-2012 Active  .  lisinopril-hydrochlorothiazide (ZESTORETIC) 20-12.5 MG tablet TAKE TWO TABLETS BY MOUTH EVERY DAY  . metFORMIN (GLUCOPHAGE) 1000 MG tablet TAKE ONE TABLET BY MOUTH TWICE DAILY  . Multiple Vitamins-Minerals (MULTIVITAMIN ADULTS PO) Take by mouth daily.   . Omega-3 Fatty Acids (FISH OIL) 1000 MG CAPS Take by mouth. Reported on 02/22/2015  . ASPIRIN ADULT LOW STRENGTH PO Take 81 mg by mouth daily.    No facility-administered encounter medications on file as of 04/13/2019.    Activities of Daily Living In your present state of health, do you have any difficulty performing the following activities: 04/13/2019  Hearing? N  Vision? N  Difficulty concentrating or making decisions? N  Walking or climbing stairs? Y  Comment Due to hip pain.  Dressing or bathing? N  Doing errands,  shopping? N  Preparing Food and eating ? N  Using the Toilet? N  In the past six months, have you accidently leaked urine? N  Do you have problems with loss of bowel control? N  Managing your Medications? N  Managing your Finances? N  Housekeeping or managing your Housekeeping? N  Some recent data might be hidden    Patient Care Team: Bacigalupo, Dionne Bucy, MD as PCP - General (Family Medicine) [facility], [facility])    Assessment:   This is a routine wellness examination for Karen.  Exercise Activities and Dietary recommendations Current Exercise Habits: Home exercise routine, Type of exercise: walking, Time (Minutes): 30, Frequency (Times/Week): 4, Weekly Exercise (Minutes/Week): 120, Intensity: Mild, Exercise limited by: orthopedic condition(s)  Goals    . Have 3 meals a day     Recommend to decrease portion sizes by eating 3 small healthy meals and at least 2 healthy snacks per day.       Fall Risk: Fall Risk  04/13/2019 04/08/2018 12/18/2016 11/29/2015 08/31/2014  Falls in the past year? 0 0 No No No  Number falls in past yr: 0 - - - -  Injury with Fall? 0 - - - -    FALL RISK PREVENTION  PERTAINING TO THE HOME:  Any stairs in or around the home? Yes  If so, are there any without handrails? No   Home free of loose throw rugs in walkways, pet beds, electrical cords, etc? Yes  Adequate lighting in your home to reduce risk of falls? Yes   ASSISTIVE DEVICES UTILIZED TO PREVENT FALLS:  Life alert? No  Use of a cane, walker or w/c? No  Grab bars in the bathroom? Yes  Shower chair or bench in shower? No  Elevated toilet seat or a handicapped toilet? Yes   TIMED UP AND GO:  Was the test performed? No .    Depression Screen PHQ 2/9 Scores 04/13/2019 04/08/2018 04/08/2018 12/18/2016  PHQ - 2 Score 1 0 0 0  PHQ- 9 Score - 0 - -     Cognitive Function: Declined today.      6CIT Screen 04/08/2018  What Year? 0 points  What month? 0 points  What time? 0 points  Count back from 20 0 points  Months in reverse 0 points  Repeat phrase 0 points  Total Score 0    Immunization History  Administered Date(s) Administered  . Fluad Quad(high Dose 65+) 10/14/2018  . Influenza Split 11/30/2005, 10/14/2009, 12/18/2011  . Influenza, High Dose Seasonal PF 11/30/2014, 11/29/2015, 11/02/2016, 11/29/2017  . Influenza,inj,Quad PF,6+ Mos 11/04/2012, 10/13/2013  . PFIZER SARS-COV-2 Vaccination 03/15/2019, 04/08/2019  . Pneumococcal Conjugate-13 01/05/2014  . Pneumococcal Polysaccharide-23 12/01/2004, 11/29/2015  . Td 12/26/1995, 12/18/2011  . Tdap 12/18/2011    Qualifies for Shingles Vaccine? Yes . Due for Shingrix. Pt has been advised to call insurance company to determine out of pocket expense. Advised may also receive vaccine at local pharmacy or Health Dept. Verbalized acceptance and understanding.  Tdap: Up to date  Flu Vaccine: Up to date  Pneumococcal Vaccine: Completed series  Screening Tests Health Maintenance  Topic Date Due  . COLONOSCOPY  11/12/1998  . OPHTHALMOLOGY EXAM  02/12/2018  . MAMMOGRAM  03/06/2019  . FOOT EXAM  04/08/2019  . HEMOGLOBIN A1C  04/13/2019  .  COLON CANCER SCREENING ANNUAL FOBT  11/04/2019  . TETANUS/TDAP  12/17/2021  . DEXA SCAN  03/05/2022  . INFLUENZA VACCINE  Completed  . Hepatitis C  Screening  Completed  . PNA vac Low Risk Adult  Completed    Cancer Screenings:  Colorectal Screening: FOBT completed 11/04/18. Repeat every year.  Mammogram: Completed 03/05/17. Repeat every 1-2 years as advised. Ordered 10/14/18- pt plans to call and set up an apt this year.  Bone Density: Completed 03/05/17. Results reflect OSTEOPENIA. Repeat every 5 years.   Lung Cancer Screening: (Low Dose CT Chest recommended if Age 72-80 years, 30 pack-year currently smoking OR have quit w/in 15years.) does not qualify.   Additional Screening:  Hepatitis C Screening: Up to date  Dental Screening: Recommended annual dental exams for proper oral hygiene   Community Resource Referral:  CRR required this visit?  No       Plan:  I have personally reviewed and addressed the Medicare Annual Wellness questionnaire and have noted the following in the patient's chart:  A. Medical and social history B. Use of alcohol, tobacco or illicit drugs  C. Current medications and supplements D. Functional ability and status E.  Nutritional status F.  Physical activity G. Advance directives H. List of other physicians I.  Hospitalizations, surgeries, and ER visits in previous 12 months J.  [place] such as hearing and vision if needed, cognitive and depression L. Referrals and appointments   In addition, I have reviewed and discussed with patient certain preventive protocols, quality metrics, and best practice recommendations. A written personalized care plan for preventive services as well as general preventive health recommendations were provided to patient.   [place] Score, [place]  [id number] Nurse Health Advisor   Nurse Notes: Pt needs a diabetic foot exam and her Hgb A1c checked at next in office apt. Pt plans to set up her  mammogram and eye exam this year.

## 2019-04-13 ENCOUNTER — Ambulatory Visit (INDEPENDENT_AMBULATORY_CARE_PROVIDER_SITE_OTHER): Payer: PPO

## 2019-04-13 ENCOUNTER — Other Ambulatory Visit: Payer: Self-pay

## 2019-04-13 DIAGNOSIS — Z0000 Encounter for general adult medical examination without abnormal findings: Secondary | ICD-10-CM

## 2019-04-13 NOTE — Patient Instructions (Signed)
[name] , Thank you for taking time to come for your Medicare Wellness Visit. I appreciate your ongoing commitment to your health goals. Please review the following plan we discussed and let me know if I can assist you in the future.   Screening recommendations/referrals: Colonoscopy: stool card up to date, due 10/2019  Mammogram: Currently due. Pt to set up apt this year.  Bone Density: Up to date, due 02/2022 Recommended yearly ophthalmology/optometry visit for glaucoma screening and checkup Recommended yearly dental visit for hygiene and checkup  Vaccinations: Influenza vaccine: Up to date Pneumococcal vaccine: Completed series Tdap vaccine: Up to date Shingles vaccine: Pt declines today.     Advanced directives: Please bring a copy of your POA (Power of Attorney) and/or Living Will to your next appointment.   Conditions/risks identified: Continue current diet and exercise plan to aid with weight loss.   Next appointment: 04/21/19 @ 9:20 AM with Dr Brita Romp    Preventive Care 32 Years and Older, Female Preventive care refers to lifestyle choices and visits with your health care provider that can promote health and wellness. What does preventive care include?  A yearly physical exam. This is also called an annual well check.  Dental exams once or twice a year.  Routine eye exams. Ask your health care provider how often you should have your eyes checked.  Personal lifestyle choices, including:  Daily care of your teeth and gums.  Regular physical activity.  Eating a healthy diet.  Avoiding tobacco and drug use.  Limiting alcohol use.  Practicing safe sex.  Taking low-dose aspirin every day.  Taking vitamin and mineral supplements as recommended by your health care provider. What happens during an annual well check? The services and screenings done by your health care provider during your annual well check will depend on your age, overall health, lifestyle risk  factors, and family history of disease. Counseling  Your health care provider may ask you questions about your:  Alcohol use.  Tobacco use.  Drug use.  Emotional well-being.  Home and relationship well-being.  Sexual activity.  Eating habits.  History of falls.  Memory and ability to understand (cognition).  Work and work Statistician.  Reproductive health. Screening  You may have the following tests or measurements:  Height, weight, and BMI.  Blood pressure.  Lipid and cholesterol levels. These may be checked every 5 years, or more frequently if you are over 71 years old.  Skin check.  Lung cancer screening. You may have this screening every year starting at age 71 if you have a 30-pack-year history of smoking and currently smoke or have quit within the past 15 years.  Fecal occult blood test (FOBT) of the stool. You may have this test every year starting at age 71.  Flexible sigmoidoscopy or colonoscopy. You may have a sigmoidoscopy every 5 years or a colonoscopy every 10 years starting at age 71.  Hepatitis C blood test.  Hepatitis B blood test.  Sexually transmitted disease (STD) testing.  Diabetes screening. This is done by checking your blood sugar (glucose) after you have not eaten for a while (fasting). You may have this done every 1-3 years.  Bone density scan. This is done to screen for osteoporosis. You may have this done starting at age 71.  Mammogram. This may be done every 1-2 years. Talk to your health care provider about how often you should have regular mammograms. Talk with your health care provider about your test results, treatment options,  and if necessary, the need for more tests. Vaccines  Your health care provider may recommend certain vaccines, such as:  Influenza vaccine. This is recommended every year.  Tetanus, diphtheria, and acellular pertussis (Tdap, Td) vaccine. You may need a Td booster every 10 years.  Zoster vaccine. You  may need this after age 71.  Pneumococcal 13-valent conjugate (PCV13) vaccine. One dose is recommended after age 71.  Pneumococcal polysaccharide (PPSV23) vaccine. One dose is recommended after age 71. Talk to your health care provider about which screenings and vaccines you need and how often you need them. This information is not intended to replace advice given to you by your health care provider. Make sure you discuss any questions you have with your health care provider. Document Released: 02/17/2015 Document Revised: 10/11/2015 Document Reviewed: 11/22/2014 Elsevier Interactive Patient Education  2017 [place] Prevention in the Home Falls can cause injuries. They can happen to people of all ages. There are many things you can do to make your home safe and to help prevent falls. What can I do on the outside of my home?  Regularly fix the edges of walkways and driveways and fix any cracks.  Remove anything that might make you trip as you walk through a door, such as a raised step or threshold.  Trim any bushes or trees on the path to your home.  Use bright outdoor lighting.  Clear any walking paths of anything that might make someone trip, such as rocks or tools.  Regularly check to see if handrails are loose or broken. Make sure that both sides of any steps have handrails.  Any raised decks and porches should have guardrails on the edges.  Have any leaves, snow, or ice cleared regularly.  Use sand or salt on walking paths during winter.  Clean up any spills in your garage right away. This includes oil or grease spills. What can I do in the bathroom?  Use night lights.  Install grab bars by the toilet and in the tub and shower. Do not use towel bars as grab bars.  Use non-skid mats or decals in the tub or shower.  If you need to sit down in the shower, use a plastic, non-slip stool.  Keep the floor dry. Clean up any water that spills on the floor as soon as  it happens.  Remove soap buildup in the tub or shower regularly.  Attach bath mats securely with double-sided non-slip rug tape.  Do not have throw rugs and other things on the floor that can make you trip. What can I do in the bedroom?  Use night lights.  Make sure that you have a light by your bed that is easy to reach.  Do not use any sheets or blankets that are too big for your bed. They should not hang down onto the floor.  Have a firm chair that has side arms. You can use this for support while you get dressed.  Do not have throw rugs and other things on the floor that can make you trip. What can I do in the kitchen?  Clean up any spills right away.  Avoid walking on wet floors.  Keep items that you use a lot in easy-to-reach places.  If you need to reach something above you, use a strong step stool that has a grab bar.  Keep electrical cords out of the [place].  Do not use floor polish or wax that makes floors slippery. If  you must use wax, use non-skid floor wax.  Do not have throw rugs and other things on the floor that can make you trip. What can I do with my stairs?  Do not leave any items on the stairs.  Make sure that there are handrails on both sides of the stairs and use them. Fix handrails that are broken or loose. Make sure that handrails are as long as the stairways.  Check any carpeting to make sure that it is firmly attached to the stairs. Fix any carpet that is loose or worn.  Avoid having throw rugs at the top or bottom of the stairs. If you do have throw rugs, attach them to the floor with carpet tape.  Make sure that you have a light switch at the top of the stairs and the bottom of the stairs. If you do not have them, ask someone to add them for you. What else can I do to help prevent falls?  Wear shoes that:  Do not have high heels.  Have rubber bottoms.  Are comfortable and fit you well.  Are closed at the toe. Do not wear sandals.  If  you use a stepladder:  Make sure that it is fully opened. Do not climb a closed stepladder.  Make sure that both sides of the stepladder are locked into place.  Ask someone to hold it for you, if possible.  Clearly mark and make sure that you can see:  Any grab bars or handrails.  First and last steps.  Where the edge of each step is.  Use tools that help you move around (mobility aids) if they are needed. These include:  Canes.  Walkers.  Scooters.  Crutches.  Turn on the lights when you go into a dark area. Replace any light bulbs as soon as they burn out.  Set up your furniture so you have a clear path. Avoid moving your furniture around.  If any of your floors are uneven, fix them.  If there are any pets around you, be aware of where they are.  Review your medicines with your doctor. Some medicines can make you feel dizzy. This can increase your chance of falling. Ask your doctor what other things that you can do to help prevent falls. This information is not intended to replace advice given to you by your health care provider. Make sure you discuss any questions you have with your health care provider. Document Released: 11/17/2008 Document Revised: 06/29/2015 Document Reviewed: 02/25/2014 Elsevier Interactive Patient Education  2017 [employer].

## 2019-04-20 NOTE — Patient Instructions (Addendum)
The CDC recommends two doses of Shingrix (the shingles vaccine) separated by 2 to 6 months for adults age 71 years and older. I recommend checking with your insurance plan regarding coverage for this vaccine.   Call and schedule a mammogram and eye exam    Preventive Care 65 Years and Older, Female Preventive care refers to lifestyle choices and visits with your health care provider that can promote health and wellness. This includes:  A yearly physical exam. This is also called an annual well check.  Regular dental and eye exams.  Immunizations.  Screening for certain conditions.  Healthy lifestyle choices, such as diet and exercise. What can I expect for my preventive care visit? Physical exam Your health care provider will check:  Height and weight. These may be used to calculate body mass index (BMI), which is a measurement that tells if you are at a healthy weight.  Heart rate and blood pressure.  Your skin for abnormal spots. Counseling Your health care provider may ask you questions about:  Alcohol, tobacco, and drug use.  Emotional well-being.  Home and relationship well-being.  Sexual activity.  Eating habits.  History of falls.  Memory and ability to understand (cognition).  Work and work Statistician.  Pregnancy and menstrual history. What immunizations do I need?  Influenza (flu) vaccine  This is recommended every year. Tetanus, diphtheria, and pertussis (Tdap) vaccine  You may need a Td booster every 10 years. Varicella (chickenpox) vaccine  You may need this vaccine if you have not already been vaccinated. Zoster (shingles) vaccine  You may need this after age 53. Pneumococcal conjugate (PCV13) vaccine  One dose is recommended after age 10. Pneumococcal polysaccharide (PPSV23) vaccine  One dose is recommended after age 27. Measles, mumps, and rubella (MMR) vaccine  You may need at least one dose of MMR if you were born in 1957 or  later. You may also need a second dose. Meningococcal conjugate (MenACWY) vaccine  You may need this if you have certain conditions. Hepatitis A vaccine  You may need this if you have certain conditions or if you travel or work in places where you may be exposed to hepatitis A. Hepatitis B vaccine  You may need this if you have certain conditions or if you travel or work in places where you may be exposed to hepatitis B. Haemophilus influenzae type b (Hib) vaccine  You may need this if you have certain conditions. You may receive vaccines as individual doses or as more than one vaccine together in one shot (combination vaccines). Talk with your health care provider about the risks and benefits of combination vaccines. What tests do I need? Blood tests  Lipid and cholesterol levels. These may be checked every 5 years, or more frequently depending on your overall health.  Hepatitis C test.  Hepatitis B test. Screening  Lung cancer screening. You may have this screening every year starting at age 35 if you have a 30-pack-year history of smoking and currently smoke or have quit within the past 15 years.  Colorectal cancer screening. All adults should have this screening starting at age 74 and continuing until age 12. Your health care provider may recommend screening at age 58 if you are at increased risk. You will have tests every 1-10 years, depending on your results and the type of screening test.  Diabetes screening. This is done by checking your blood sugar (glucose) after you have not eaten for a while (fasting). You may have  this done every 1-3 years.  Mammogram. This may be done every 1-2 years. Talk with your health care provider about how often you should have regular mammograms.  BRCA-related cancer screening. This may be done if you have a family history of breast, ovarian, tubal, or peritoneal cancers. Other tests  Sexually transmitted disease (STD) testing.  Bone density  scan. This is done to screen for osteoporosis. You may have this done starting at age 72. Follow these instructions at home: Eating and drinking  Eat a diet that includes fresh fruits and vegetables, whole grains, lean protein, and low-fat dairy products. Limit your intake of foods with high amounts of sugar, saturated fats, and salt.  Take vitamin and mineral supplements as recommended by your health care provider.  Do not drink alcohol if your health care provider tells you not to drink.  If you drink alcohol: ? Limit how much you have to 0-1 drink a day. ? Be aware of how much alcohol is in your drink. In the [place] one drink equals one 12 oz bottle of beer (355 mL), one 5 oz glass of wine (148 mL), or one 1 oz glass of hard liquor (44 mL). Lifestyle  Take daily care of your teeth and gums.  Stay active. Exercise for at least 30 minutes on 5 or more days each week.  Do not use any products that contain nicotine or tobacco, such as cigarettes, e-cigarettes, and chewing tobacco. If you need help quitting, ask your health care provider.  If you are sexually active, practice safe sex. Use a condom or other form of protection in order to prevent STIs (sexually transmitted infections).  Talk with your health care provider about taking a low-dose aspirin or statin. What's next?  Go to your health care provider once a year for a well check visit.  Ask your health care provider how often you should have your eyes and teeth checked.  Stay up to date on all vaccines. This information is not intended to replace advice given to you by your health care provider. Make sure you discuss any questions you have with your health care provider. Document Revised: 01/15/2018 Document Reviewed: 01/15/2018 Elsevier Patient Education  2020 [employer].

## 2019-04-20 NOTE — Progress Notes (Signed)
[name], Female    DOB: 24-May-1950, 69 y.o.   MRN: [id number] Visit Date: 04/21/2019  Today's Provider: Lavon Paganini, MD   Chief Complaint  Patient presents with  . Annual Exam   Subjective:     Complete Physical Karen Olsen is a 69 y.o. female. She feels well. She reports exercising regularly. She reports she is sleeping well.  -----------------------------------------------------------   Review of Systems  Constitutional: Negative.   HENT: Negative.   Eyes: Negative.   Respiratory: Negative.   Cardiovascular: Negative.   Gastrointestinal: Negative.   Endocrine: Negative.   Genitourinary: Negative.   Musculoskeletal: Negative.   Skin: Negative.   Allergic/Immunologic: Negative.   Neurological: Negative.   Hematological: Negative.   Psychiatric/Behavioral: Negative.     Social History   Socioeconomic History  . Marital status: Married    Spouse name: Balinda Quails  . Number of children: 1  . Years of education: 51  . Highest education level: Associate degree: occupational, Hotel manager, or vocational program  Occupational History    Employer: OTHER    Comment: Adult nurse  Tobacco Use  . Smoking status: Never Smoker  . Smokeless tobacco: Never Used  Substance and Sexual Activity  . Alcohol use: Not Currently  . Drug use: No  . Sexual activity: Not Currently  Other Topics Concern  . Not on file  Social History Narrative  . Not on file   Social Determinants of Health   Financial Resource Strain: Low Risk   . Difficulty of Paying Living Expenses: Not hard at all  Food Insecurity: No Food Insecurity  . Worried About Charity fundraiser in the Last Year: Never true  . Ran Out of Food in the Last Year: Never true  Transportation Needs: No Transportation Needs  . Lack of Transportation (Medical): No  . Lack of Transportation (Non-Medical): No  Physical Activity: Insufficiently Active  . Days of Exercise per Week: 4 days  .  Minutes of Exercise per Session: 30 min  Stress: No Stress Concern Present  . Feeling of Stress : Only a little  Social Connections: Slightly Isolated  . Frequency of Communication with Friends and Family: More than three times a week  . Frequency of Social Gatherings with Friends and Family: More than three times a week  . Attends Religious Services: More than 4 times per year  . Active Member of Clubs or Organizations: No  . Attends Archivist Meetings: Never  . Marital Status: Married  Human resources officer Violence: Not At Risk  . Fear of Current or Ex-Partner: No  . Emotionally Abused: No  . Physically Abused: No  . Sexually Abused: No    Past Medical History:  Diagnosis Date  . Diabetes mellitus without complication ([place])   . Hyperlipidemia   . Hypertension      Patient Active Problem List   Diagnosis Date Noted  . Osteopenia 12/18/2016  . 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 Obesity 09/11/2016  . Diabetic neuropathy ([place]) 11/29/2015  . Headache 11/17/2014  . Hemangioma of liver 08/31/2014  . Benign neoplasm of kidney 06/21/2014  . Diabetes mellitus, type 2 ([place]) 06/21/2014  . Essential (primary) hypertension 06/21/2014  . Blood in the urine 06/21/2014  . Hyperlipidemia associated with type 2 diabetes mellitus ([place]) 06/21/2014  . Burning or prickling sensation 06/21/2014  . Benign melanoma 06/21/2014  . Fibroid 06/21/2014  . Avitaminosis D 06/21/2014  . 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 HERNIA REPAIR  01/2013   Dr. Burt Knack  . LIPOMA EXCISION     Removed from back.  . 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 RENAL CYST EXCISION Right   . SKIN CANCER EXCISION    . UTERINE FIBROID SURGERY      Her family history includes Alzheimer's disease in her maternal grandfather; Cancer in her paternal grandfather and paternal grandmother; Congestive Heart Failure in her  father; Prostate cancer in her father; Stroke in her maternal grandfather, maternal grandmother, and mother. There is no history of Breast cancer.   Current Outpatient Medications:  .  acetaminophen (TYLENOL) 500 MG tablet, Take 500 mg by mouth every 6 (six) hours as needed., Disp: , Rfl:  .  amLODipine (NORVASC) 10 MG tablet, TAKE ONE TABLET BY MOUTH EVERY DAY, Disp: 90 tablet, Rfl: 3 .  ASPIRIN ADULT LOW STRENGTH PO, Take 81 mg by mouth daily. , Disp: , Rfl:  .  atorvastatin (LIPITOR) 20 MG tablet, Take 1 tablet (20 mg total) by mouth daily., Disp: 90 tablet, Rfl: 1 .  Coenzyme Q10 (COQ10) 200 MG CAPS, Take 1 capsule by mouth daily. (Patient taking differently: Take 100 mg by mouth daily. ), Disp: 30 capsule, Rfl: 0 .  glucose blood test strip, RELION CONFIRM/MICRO TEST (In Vitro Strip)  1 (one) Strip Strip daily for 0 days  Quantity: 100;  Refills: 3   Ordered :29-July-2012  [name] ;  Started 29-July-2012 Active, Disp: , Rfl:  .  lisinopril-hydrochlorothiazide (ZESTORETIC) 20-12.5 MG tablet, TAKE TWO TABLETS BY MOUTH EVERY DAY, Disp: 180 tablet, Rfl: 0 .  metFORMIN (GLUCOPHAGE) 1000 MG tablet, TAKE ONE TABLET BY MOUTH TWICE DAILY, Disp: 180 tablet, Rfl: 2 .  Multiple Vitamins-Minerals (MULTIVITAMIN ADULTS PO), Take by mouth daily. , Disp: , Rfl:  .  Omega-3 Fatty Acids (FISH OIL) 1000 MG CAPS, Take by mouth. Reported on 02/22/2015, Disp: , Rfl:   Patient Care Team: Virginia Crews, MD as PCP - General (Family Medicine) [place], [place] (Optometry)     Objective:    Vitals: BP 118/77 (BP Location: Left Arm, Patient Position: Sitting, Cuff Size: Large)   Pulse 73   Temp (!) 96.2 F (35.7 C) (Temporal)   Ht 5\' 2"  (1.575 m)   Wt 166 lb (75.3 kg)   BMI 30.36 kg/m   Physical Exam Vitals reviewed.  Constitutional:      General: She is not in acute distress.    Appearance: Normal appearance. She is well-developed. She is not diaphoretic.  HENT:     Head:  Normocephalic and atraumatic.     Right Ear: Tympanic membrane, ear canal and external ear normal.     Left Ear: Tympanic membrane, ear canal and external ear normal.     Nose: Nose normal.     Mouth/Throat:     Mouth: Mucous membranes are moist.     Pharynx: Oropharynx is clear. No oropharyngeal exudate.  Eyes:     General: No scleral icterus.    Conjunctiva/sclera: Conjunctivae normal.     Pupils: Pupils are equal, round, and reactive to light.  Neck:     Thyroid: No thyromegaly.  Cardiovascular:     Rate and Rhythm: Normal rate and regular rhythm.     Pulses: Normal pulses.     Heart sounds: Normal heart sounds. No murmur.  Pulmonary:     Effort: Pulmonary effort is normal. No respiratory  distress.     Breath sounds: Normal breath sounds. No wheezing or rales.  Abdominal:     General: There is no distension.     Palpations: Abdomen is soft.     Tenderness: There is no abdominal tenderness. There is no guarding or rebound.  Musculoskeletal:        General: No deformity.     Cervical back: Neck supple.     Right lower leg: No edema.     Left lower leg: No edema.  Lymphadenopathy:     Cervical: No cervical adenopathy.  Skin:    General: Skin is warm and dry.     Findings: No rash.  Neurological:     Mental Status: She is alert and oriented to person, place, and time. Mental status is at baseline.  Psychiatric:        Mood and Affect: Mood normal.        Behavior: Behavior normal.        Thought Content: Thought content normal.     Activities of Daily Living In your present state of health, do you have any difficulty performing the following activities: 04/21/2019 04/13/2019  Hearing? N N  Vision? N N  Difficulty concentrating or making decisions? N N  Walking or climbing stairs? N Y  Comment - Due to hip pain.  Dressing or bathing? N N  Doing errands, shopping? N N  Preparing Food and eating ? - N  Using the Toilet? - N  In the past six months, have you accidently  leaked urine? - N  Do you have problems with loss of bowel control? - N  Managing your Medications? - N  Managing your Finances? - N  Housekeeping or managing your Housekeeping? - N  Some recent data might be hidden    Fall Risk Assessment Fall Risk  04/21/2019 04/13/2019 04/08/2018 12/18/2016 11/29/2015  Falls in the past year? 0 0 0 No No  Number falls in past yr: 0 0 - - -  Injury with Fall? 0 0 - - -  Follow up Falls evaluation completed - - - -     Depression Screen PHQ 2/9 Scores 04/21/2019 04/13/2019 04/08/2018 04/08/2018  PHQ - 2 Score 1 1 0 0  PHQ- 9 Score 2 - 0 -    6CIT Screen 04/08/2018  What Year? 0 points  What month? 0 points  What time? 0 points  Count back from 20 0 points  Months in reverse 0 points  Repeat phrase 0 points  Total Score 0       Assessment & Plan:    Annual Physical Reviewed patient's Family Medical History Reviewed and updated list of patient's medical providers Assessment of cognitive impairment was done Assessed patient's functional ability Established a written schedule for health screening [place] Completed and Reviewed  Exercise Activities and Dietary recommendations Goals    . Have 3 meals a day     Recommend to decrease portion sizes by eating 3 small healthy meals and at least 2 healthy snacks per day.       Immunization History  Administered Date(s) Administered  . Fluad Quad(high Dose 65+) 10/14/2018  . Influenza Split 11/30/2005, 10/14/2009, 12/18/2011  . Influenza, High Dose Seasonal PF 11/30/2014, 11/29/2015, 11/02/2016, 11/29/2017  . Influenza,inj,Quad PF,6+ Mos 11/04/2012, 10/13/2013  . PFIZER SARS-COV-2 Vaccination 03/15/2019, 04/08/2019  . Pneumococcal Conjugate-13 01/05/2014  . Pneumococcal Polysaccharide-23 12/01/2004, 11/29/2015  . Td 12/26/1995, 12/18/2011  . Tdap 12/18/2011  Health Maintenance  Topic Date Due  . COLONOSCOPY  Never done  . OPHTHALMOLOGY EXAM  02/12/2018  . MAMMOGRAM   03/06/2019  . FOOT EXAM  04/08/2019  . HEMOGLOBIN A1C  04/13/2019  . COLON CANCER SCREENING ANNUAL FOBT  11/04/2019  . TETANUS/TDAP  12/17/2021  . DEXA SCAN  03/05/2022  . INFLUENZA VACCINE  Completed  . Hepatitis C Screening  Completed  . PNA vac Low Risk Adult  Completed     Discussed health benefits of physical activity, and encouraged her to engage in regular exercise appropriate for her age and condition.    ------------------------------------------------------------------------------------------------------------  Problem List Items Addressed This Visit      Cardiovascular and Mediastinum   Essential (primary) hypertension    Well controlled Continue current medications Recheck metabolic panel F/u in 6 months       Relevant Orders   Comprehensive metabolic panel     Endocrine   Diabetes mellitus, type 2 ([place])    Well controlled previously  Associated with HTN and HLD Continue current medications Recheck A1c On ACEi and statin Foot exam today Needs to schedule eye exam UTD on vaccines F/u in 6 months      Relevant Orders   Hemoglobin A1c   Hyperlipidemia associated with type 2 diabetes mellitus ([place])    Recently increased atorvastatin dose Tolerating well Recheck FLP and CMP Goal LDL <70      Relevant Orders   Comprehensive metabolic panel   Lipid panel   Diabetic neuropathy ([facility])    Chronic and fairly mild Recent increase in symptoms Not on medications Check B12 level        Other   Avitaminosis D    Continue supplement Recheck Vit D level      Relevant Orders   VITAMIN D 25 Hydroxy (Vit-D Deficiency, Fractures)   Obesity    Discussed importance of healthy weight management Discussed diet and exercise       Relevant Orders   Hemoglobin A1c   Comprehensive metabolic panel   Lipid panel   B12   VITAMIN D 25 Hydroxy (Vit-D Deficiency, Fractures)    Other Visit Diagnoses    Encounter for annual physical exam    -  Primary    Relevant Orders   Hemoglobin A1c   Comprehensive metabolic panel   Lipid panel   B12   VITAMIN D 25 Hydroxy (Vit-D Deficiency, Fractures)   Long-term use of high-risk medication       Relevant Orders   B12       Return in about 6 months (around 10/22/2019) for chronic disease f/u.   The entirety of the information documented in the History of Present Illness, Review of Systems and Physical Exam were personally obtained by me. Portions of this information were initially documented by Ashley Royalty, CMA and reviewed by me for thoroughness and accuracy.    Rayne Cowdrey, Dionne Bucy, MD MPH [facility]

## 2019-04-21 ENCOUNTER — Ambulatory Visit: Payer: PPO

## 2019-04-21 ENCOUNTER — Ambulatory Visit (INDEPENDENT_AMBULATORY_CARE_PROVIDER_SITE_OTHER): Payer: PPO | Admitting: Family Medicine

## 2019-04-21 ENCOUNTER — Other Ambulatory Visit: Payer: Self-pay

## 2019-04-21 ENCOUNTER — Encounter: Payer: Self-pay | Admitting: Family Medicine

## 2019-04-21 VITALS — BP 118/77 | HR 73 | Temp 96.2°F | Ht 62.0 in | Wt 166.0 lb

## 2019-04-21 DIAGNOSIS — Z79899 Other long term (current) drug therapy: Secondary | ICD-10-CM

## 2019-04-21 DIAGNOSIS — E1169 Type 2 diabetes mellitus with other specified complication: Secondary | ICD-10-CM | POA: Diagnosis not present

## 2019-04-21 DIAGNOSIS — Z6832 Body mass index (BMI) 32.0-32.9, adult: Secondary | ICD-10-CM

## 2019-04-21 DIAGNOSIS — E669 Obesity, unspecified: Secondary | ICD-10-CM

## 2019-04-21 DIAGNOSIS — I10 Essential (primary) hypertension: Secondary | ICD-10-CM | POA: Diagnosis not present

## 2019-04-21 DIAGNOSIS — E785 Hyperlipidemia, unspecified: Secondary | ICD-10-CM

## 2019-04-21 DIAGNOSIS — Z0000 Encounter for general adult medical examination without abnormal findings: Secondary | ICD-10-CM | POA: Diagnosis not present

## 2019-04-21 DIAGNOSIS — E559 Vitamin D deficiency, unspecified: Secondary | ICD-10-CM

## 2019-04-21 DIAGNOSIS — E1142 Type 2 diabetes mellitus with diabetic polyneuropathy: Secondary | ICD-10-CM

## 2019-04-21 NOTE — Assessment & Plan Note (Signed)
Continue supplement Recheck Vit D level 

## 2019-04-21 NOTE — Assessment & Plan Note (Signed)
Recently increased atorvastatin dose Tolerating well Recheck FLP and CMP Goal LDL <70

## 2019-04-21 NOTE — Assessment & Plan Note (Signed)
Well controlled Continue current medications Recheck metabolic panel F/u in 6 months  

## 2019-04-21 NOTE — Assessment & Plan Note (Addendum)
Well controlled previously  Associated with HTN and HLD Continue current medications Recheck A1c On ACEi and statin Foot exam today Needs to schedule eye exam UTD on vaccines F/u in 6 months

## 2019-04-21 NOTE — Assessment & Plan Note (Signed)
Chronic and fairly mild Recent increase in symptoms Not on medications Check B12 level

## 2019-04-21 NOTE — Assessment & Plan Note (Signed)
Discussed importance of healthy weight management Discussed diet and exercise  

## 2019-04-22 LAB — COMPREHENSIVE METABOLIC PANEL
ALT: 14 IU/L (ref 0–32)
AST: 15 IU/L (ref 0–40)
Albumin/Globulin Ratio: 2.2 (ref 1.2–2.2)
Albumin: 4.6 g/dL (ref 3.8–4.8)
Alkaline Phosphatase: 42 IU/L (ref 39–117)
BUN/Creatinine Ratio: 20 (ref 12–28)
BUN: 18 mg/dL (ref 8–27)
Bilirubin Total: 0.4 mg/dL (ref 0.0–1.2)
CO2: 24 mmol/L (ref 20–29)
Calcium: 10.1 mg/dL (ref 8.7–10.3)
Chloride: 99 mmol/L (ref 96–106)
Creatinine, Ser: 0.92 mg/dL (ref 0.57–1.00)
GFR calc Af Amer: 73 mL/min/{1.73_m2} (ref >59)
GFR calc non Af Amer: 63 mL/min/{1.73_m2} (ref >59)
Globulin, Total: 2.1 g/dL (ref 1.5–4.5)
Glucose: 135 mg/dL — ABNORMAL HIGH (ref 65–99)
Potassium: 3.9 mmol/L (ref 3.5–5.2)
Sodium: 140 mmol/L (ref 134–144)
Total Protein: 6.7 g/dL (ref 6.0–8.5)

## 2019-04-22 LAB — LIPID PANEL
Chol/HDL Ratio: 2.8 ratio (ref 0.0–4.4)
Cholesterol, Total: 151 mg/dL (ref 100–199)
HDL: 53 mg/dL (ref >39)
LDL Chol Calc (NIH): 76 mg/dL (ref 0–99)
Triglycerides: 121 mg/dL (ref 0–149)
VLDL Cholesterol Cal: 22 mg/dL (ref 5–40)

## 2019-04-22 LAB — HEMOGLOBIN A1C
Est. average glucose Bld gHb Est-mCnc: 143 mg/dL
Hgb A1c MFr Bld: 6.6 % — ABNORMAL HIGH (ref 4.8–5.6)

## 2019-04-22 LAB — VITAMIN D 25 HYDROXY (VIT D DEFICIENCY, FRACTURES): Vit D, 25-Hydroxy: 37.3 ng/mL (ref 30.0–100.0)

## 2019-04-22 LAB — VITAMIN B12: Vitamin B-12: 480 pg/mL (ref 232–1245)

## 2019-06-01 ENCOUNTER — Other Ambulatory Visit: Payer: Self-pay | Admitting: Family Medicine

## 2019-06-01 NOTE — Telephone Encounter (Signed)
LDL Chol Calc (NIH)  Date Value Ref Range Status  04/21/2019 76 0 - 99 mg/dL Final      Total Cholesterol in normal range and within 360 days       Cholesterol, Total  Date Value Ref Range Status  04/21/2019 151 100 - 199 mg/dL Final      HDL in normal range and within 360 days       HDL  Date Value Ref Range Status  04/21/2019 53 >39 mg/dL Final      Triglycerides in normal range and within 360 days       Triglycerides  Date Value Ref Range Status  04/21/2019 121 0 - 149 mg/dL Final      Patient is not pregnant

## 2019-06-09 ENCOUNTER — Other Ambulatory Visit: Payer: Self-pay | Admitting: Family Medicine

## 2019-06-09 DIAGNOSIS — I10 Essential (primary) hypertension: Secondary | ICD-10-CM

## 2019-06-09 NOTE — Telephone Encounter (Signed)
Requested Prescriptions  Pending Prescriptions Disp Refills  . amLODipine (NORVASC) 10 MG tablet [Pharmacy Med Name: AMLODIPINE BESYLATE 10 MG TAB] 90 tablet 1    Sig: TAKE 1 TABLET BY MOUTH DAILY     Cardiovascular:  Calcium Channel Blockers Passed - 06/09/2019 10:22 AM      Passed - Last BP in normal range    BP Readings from Last 1 Encounters:  04/21/19 118/77         Passed - Valid encounter within last 6 months    Recent Outpatient Visits          1 month ago Encounter for annual physical exam   [facility], Dionne Bucy, MD   7 months ago Type 2 diabetes mellitus with diabetic polyneuropathy, without long-term current use of insulin [facility])   [facility], Dionne Bucy, MD   1 year ago Encounter for annual physical exam   [facility] [place], Dionne Bucy, MD   1 year ago Type 2 diabetes mellitus with hyperglycemia, without long-term current use of insulin [facility])   [facility], Dionne Bucy, MD   2 years ago Encounter for [employer] annual wellness exam   [facility], Dionne Bucy, MD      Future Appointments            In 4 months [place], Dionne Bucy, MD [facility] [place]

## 2019-08-31 ENCOUNTER — Other Ambulatory Visit: Payer: Self-pay | Admitting: Family Medicine

## 2019-08-31 NOTE — Telephone Encounter (Signed)
Last RF: 06/01/19 RF due: yes Active med list: yes Future visit scheduled: yes  Unable to put in protocol information several * and cannot delete a portion without deletion of entire med hx

## 2019-09-20 ENCOUNTER — Other Ambulatory Visit: Payer: Self-pay | Admitting: Family Medicine

## 2019-09-20 DIAGNOSIS — I10 Essential (primary) hypertension: Secondary | ICD-10-CM

## 2019-09-22 DIAGNOSIS — H43393 Other vitreous opacities, bilateral: Secondary | ICD-10-CM | POA: Diagnosis not present

## 2019-09-22 DIAGNOSIS — H35363 Drusen (degenerative) of macula, bilateral: Secondary | ICD-10-CM | POA: Diagnosis not present

## 2019-09-22 DIAGNOSIS — H04123 Dry eye syndrome of bilateral lacrimal glands: Secondary | ICD-10-CM | POA: Diagnosis not present

## 2019-09-22 DIAGNOSIS — H2513 Age-related nuclear cataract, bilateral: Secondary | ICD-10-CM | POA: Diagnosis not present

## 2019-09-22 DIAGNOSIS — H3560 Retinal hemorrhage, unspecified eye: Secondary | ICD-10-CM | POA: Diagnosis not present

## 2019-09-22 DIAGNOSIS — E119 Type 2 diabetes mellitus without complications: Secondary | ICD-10-CM | POA: Diagnosis not present

## 2019-10-01 ENCOUNTER — Telehealth: Payer: Self-pay | Admitting: Family Medicine

## 2019-10-01 NOTE — Telephone Encounter (Signed)
Please review. Thanks!  

## 2019-10-01 NOTE — Telephone Encounter (Signed)
Patient advised as below. Patient reports she will call back to schedule an appointment.

## 2019-10-01 NOTE — Telephone Encounter (Signed)
Patient wanting to know since her appt had to be rescheduled she has a lot going on.  Wanting to know if she can have her labs ordered and come in for labs since nothing has changed.  Please call pt back to let her know.  Thanks, [employer]

## 2019-10-01 NOTE — Telephone Encounter (Signed)
I understand.  We really need to check blood pressure and check in on diabetes and HTN at least every 6 months.  We could do a virtual visit instead if she'd like.  Maybe we could find a [place] to fit it in somewhere near her rescheduled appt.  Please apologize for me for all the rescheduling.

## 2019-10-25 ENCOUNTER — Other Ambulatory Visit: Payer: Self-pay | Admitting: Family Medicine

## 2019-10-25 DIAGNOSIS — E119 Type 2 diabetes mellitus without complications: Secondary | ICD-10-CM

## 2019-10-25 NOTE — Telephone Encounter (Signed)
Requested Prescriptions  Pending Prescriptions Disp Refills   metFORMIN (GLUCOPHAGE) 1000 MG tablet [Pharmacy Med Name: METFORMIN HCL 1000 MG TAB] 180 tablet 2    Sig: TAKE 1 TABLET BY MOUTH TWICE DAILY     Endocrinology:  Diabetes - Biguanides Failed - 10/25/2019 11:36 AM      Failed - HBA1C is between 0 and 7.9 and within 180 days    Hgb A1c MFr Bld  Date Value Ref Range Status  04/21/2019 6.6 (H) 4.8 - 5.6 % Final    Comment:             Prediabetes: 5.7 - 6.4          Diabetes: >6.4          Glycemic control for adults with diabetes: <7.0          Failed - Valid encounter within last 6 months    Recent Outpatient Visits          6 months ago Encounter for annual physical exam   [facility], Dionne Bucy, MD   1 year ago Type 2 diabetes mellitus with diabetic polyneuropathy, without long-term current use of insulin ([place])   [facility] [place], Dionne Bucy, MD   1 year ago Encounter for annual physical exam   [facility] [place], Dionne Bucy, MD   2 years ago Type 2 diabetes mellitus with hyperglycemia, without long-term current use of insulin [facility])   [facility], Dionne Bucy, MD   2 years ago Encounter for Medicare annual wellness exam   [facility], Dionne Bucy, MD             Passed - Cr in normal range and within 360 days    Creatinine  Date Value Ref Range Status  01/11/2013 0.92 0.60 - 1.30 mg/dL Final   Creatinine, Ser  Date Value Ref Range Status  04/21/2019 0.92 0.57 - 1.00 mg/dL Final         Passed - eGFR in normal range and within 360 days    EGFR (African American)  Date Value Ref Range Status  01/11/2013 >60  Final   GFR calc Af Amer  Date Value Ref Range Status  04/21/2019 73 >59 mL/min/1.73 Final   EGFR (Non-African Amer.)  Date Value Ref Range Status  01/11/2013 >60  Final    Comment:    eGFR values <95m/min/1.73 m2 may be an indication of  chronic kidney disease (CKD). Calculated eGFR is useful in patients with stable renal function. The eGFR calculation will not be reliable in acutely ill patients when serum creatinine is changing rapidly. It is not useful in  patients on dialysis. The eGFR calculation may not be applicable to patients at the low and high extremes of body sizes, pregnant women, and vegetarians.    GFR calc non Af Amer  Date Value Ref Range Status  04/21/2019 63 >59 mL/min/1.73 Final

## 2019-10-27 ENCOUNTER — Ambulatory Visit: Payer: PPO | Admitting: Family Medicine

## 2019-11-28 IMAGING — US US RENAL
1 series · 14 of 25 positions shown · non-contrast
Comparison: 09/18/2016 and MRI 06/28/2015 as well as CT 01/10/2013

CLINICAL DATA: Angiomyolipoma of kidney.

EXAM:
RENAL / URINARY TRACT ULTRASOUND COMPLETE

[Series 1: us renal · 0.23mm/px · 14 of 34 slices shown]
[im 1/34]
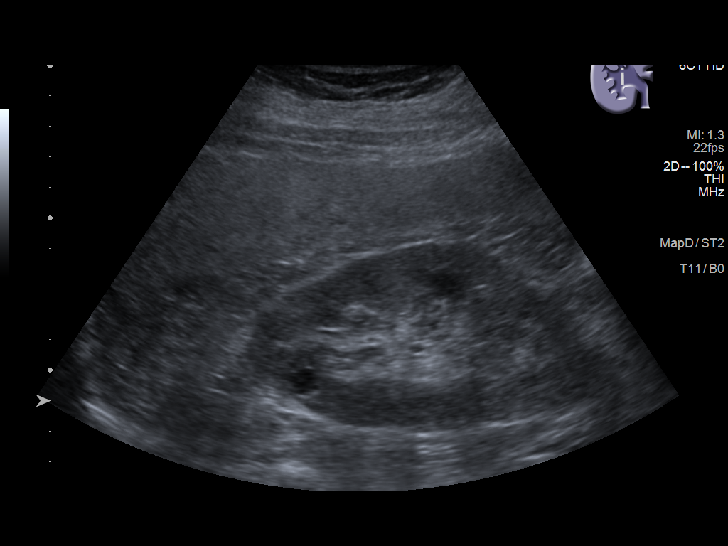
[im 3/34]
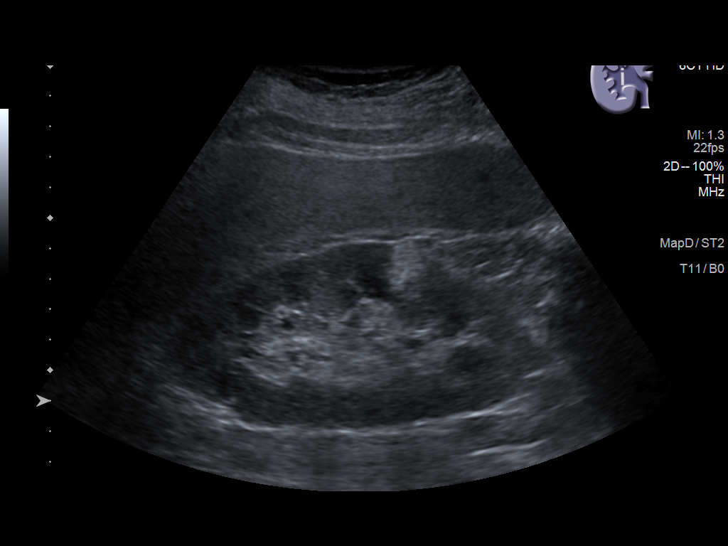
[im 6/34]
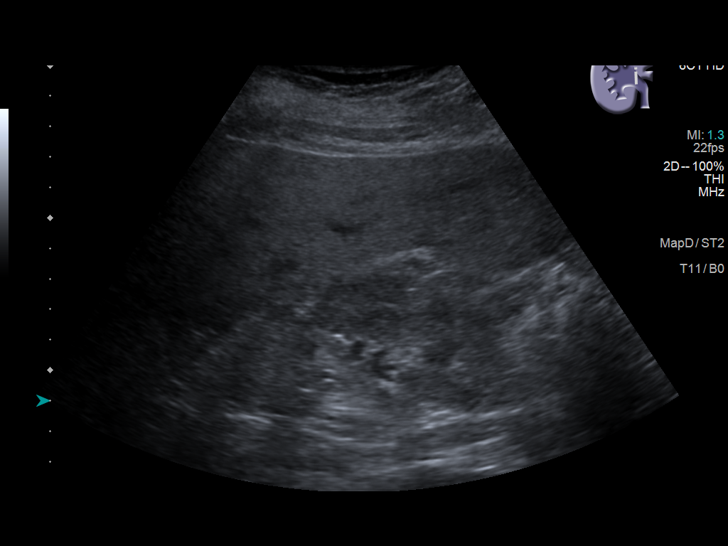
[im 9/34]
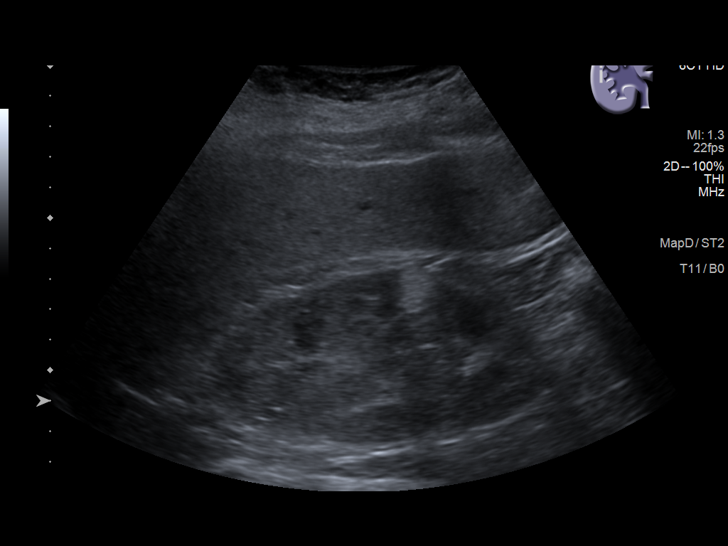
[im 12/34]
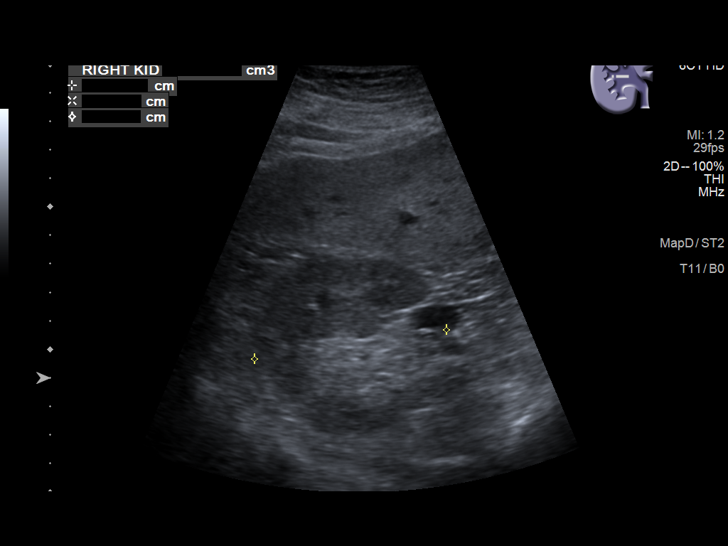
[im 13/34]
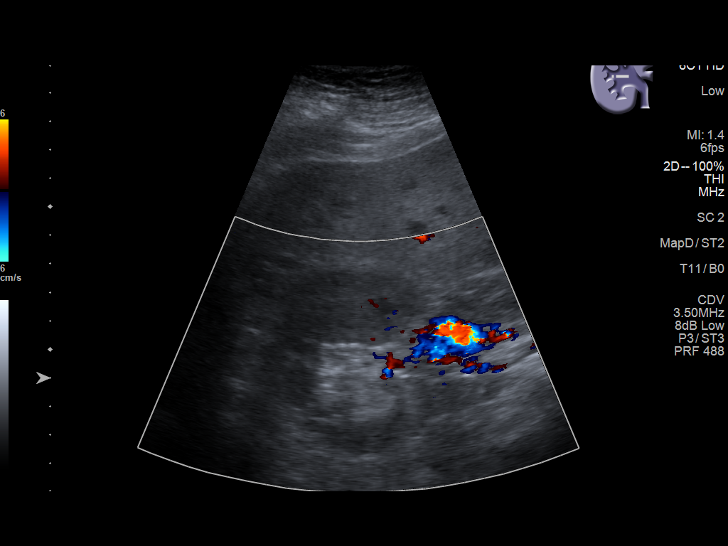
[im 16/34]
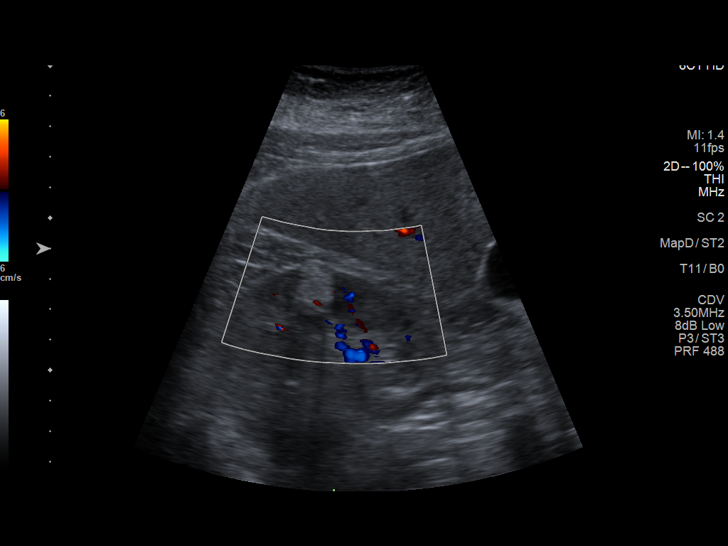
[im 18/34]
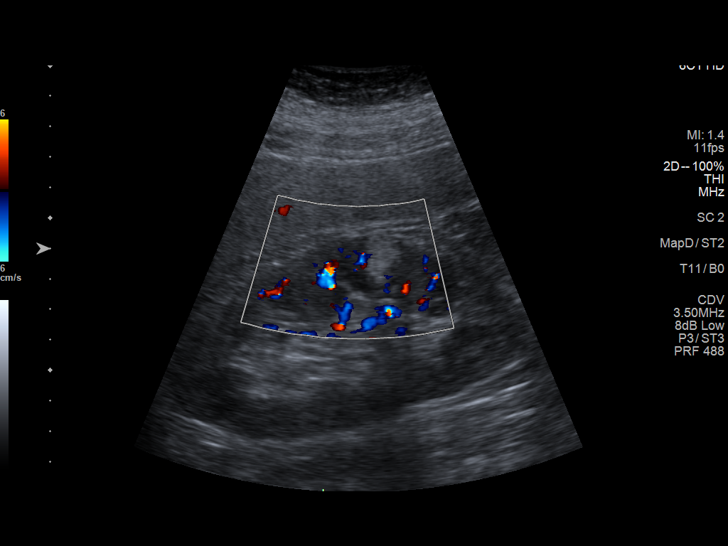
[im 21/34]
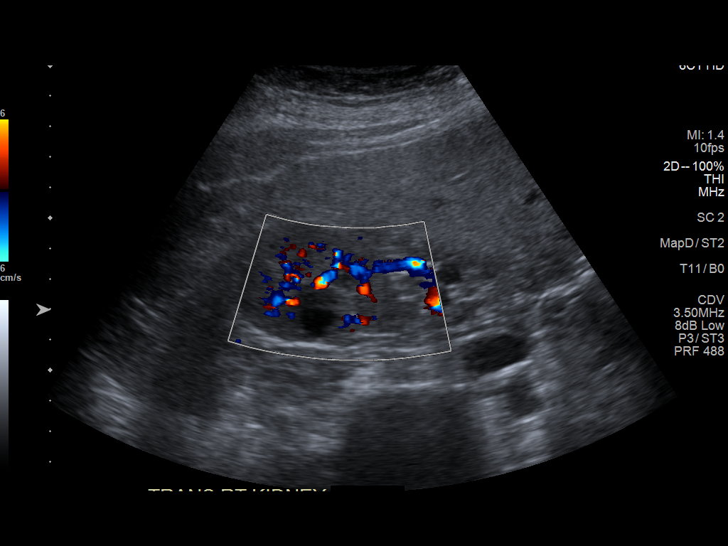
[im 23/34]
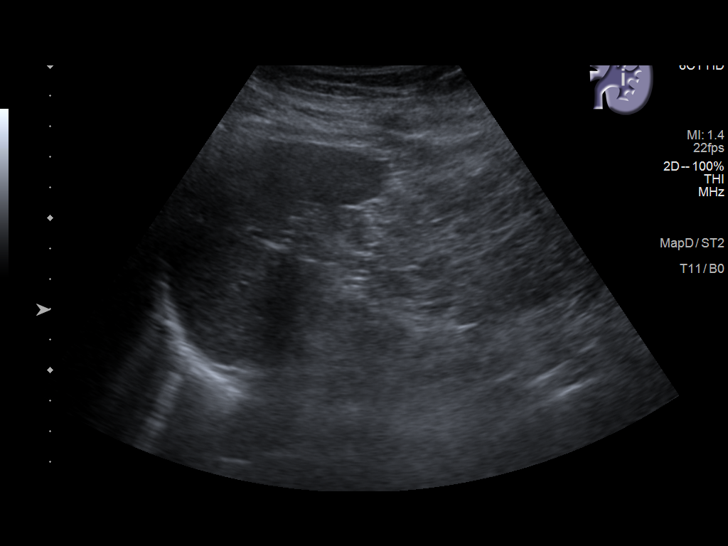
[im 25/34]
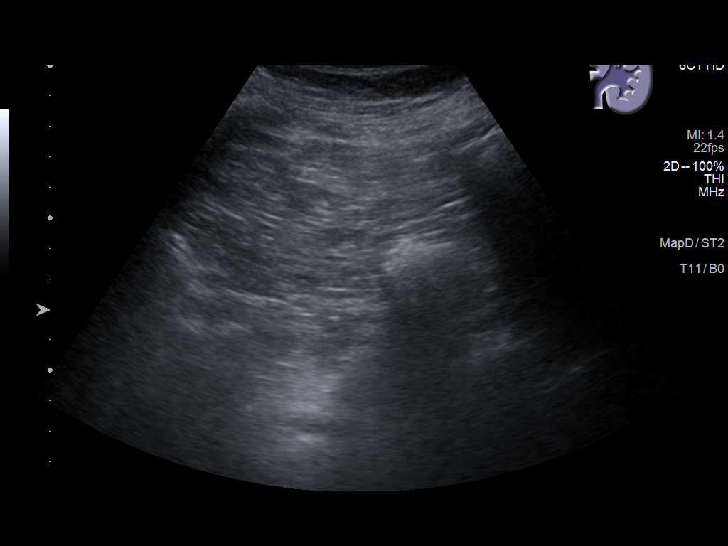
[im 28/34]
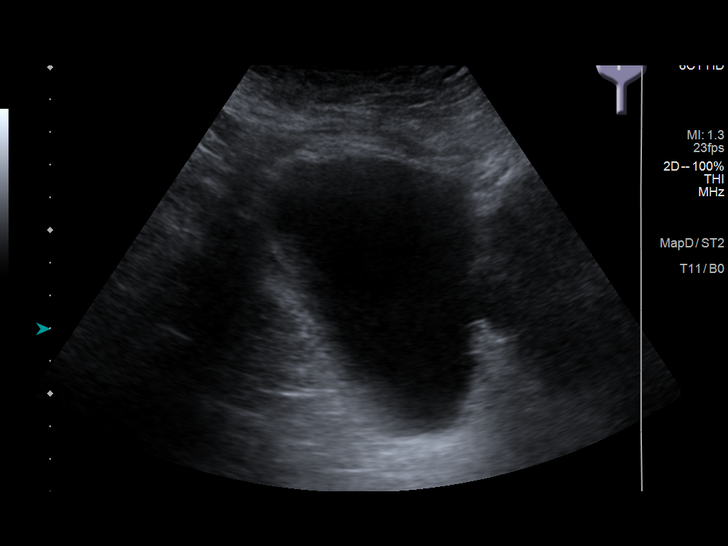
[im 31/34]
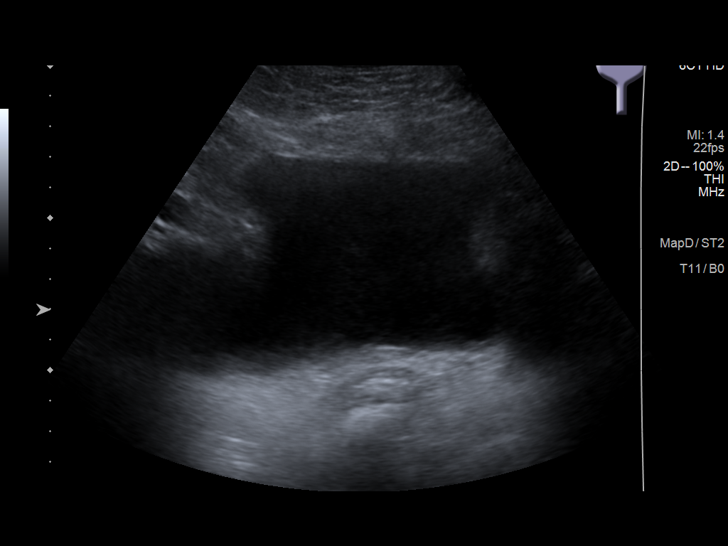
[im 34/34]
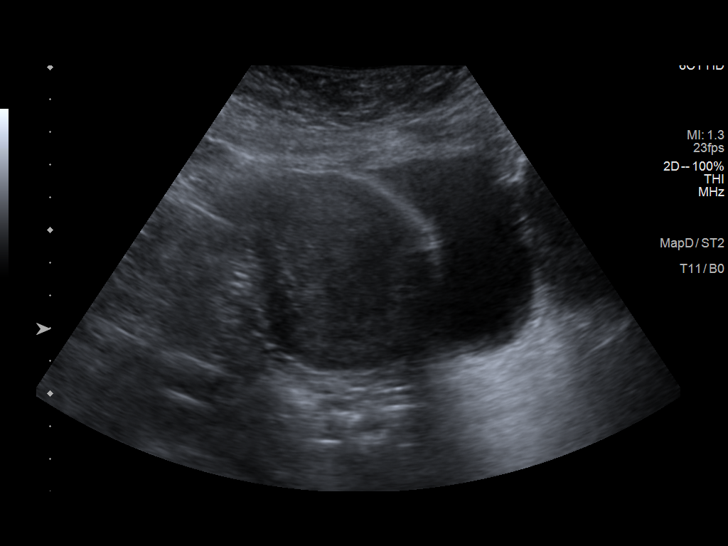

[14 of 25 positions shown; findings below may reference images not displayed]

FINDINGS: Right Kidney:

Renal measurements: 12.4 x 6.1 x 6.8 cm = volume: 271 mL .
Echogenicity within normal limits. No hydronephrosis visualized.
Hyperechoic mass over the mid to lower pole cortex measuring 1.2 x
1.2 x 1.7 cm unchanged. 1.4 cm cyst over the upper pole.

Left Kidney:

Previous left nephrectomy.

Bladder:

Appears normal for degree of bladder distention.

Incidentally noted is a moderate-sized uterine fibroid noted on
previous CT.
IMPRESSION: Normal size right kidney without hydronephrosis. Previous left
nephrectomy. Stable AML over the mid to upper pole right kidney.
cm right renal cyst.

## 2019-12-13 ENCOUNTER — Other Ambulatory Visit: Payer: Self-pay | Admitting: Family Medicine

## 2019-12-13 DIAGNOSIS — I10 Essential (primary) hypertension: Secondary | ICD-10-CM

## 2019-12-13 NOTE — Telephone Encounter (Signed)
Requested medication (s) are due for refill today: yes  Requested medication (s) are on the active medication list: yes  Last refill:  06/09/19  Future visit scheduled: no- called pt but no answer  Notes to clinic:  needs appt   Requested Prescriptions  Pending Prescriptions Disp Refills   amLODipine (NORVASC) 10 MG tablet [Pharmacy Med Name: AMLODIPINE BESYLATE 10 MG TAB] 90 tablet 1    Sig: TAKE 1 TABLET BY MOUTH DAILY      Cardiovascular:  Calcium Channel Blockers Failed - 12/13/2019 11:09 AM      Failed - Valid encounter within last 6 months    Recent Outpatient Visits           7 months ago Encounter for annual physical exam   [facility] [place], Dionne Bucy, MD   1 year ago Type 2 diabetes mellitus with diabetic polyneuropathy, without long-term current use of insulin [facility])   [facility] [place], Dionne Bucy, MD   1 year ago Encounter for annual physical exam   [facility] [place], Dionne Bucy, MD   2 years ago Type 2 diabetes mellitus with hyperglycemia, without long-term current use of insulin [facility])   [place], Dionne Bucy, MD   2 years ago Encounter for Medicare annual wellness exam   [facility] Virginia Crews, MD              Passed - Last BP in normal range    BP Readings from Last 1 Encounters:  04/21/19 118/77

## 2019-12-20 ENCOUNTER — Other Ambulatory Visit: Payer: Self-pay | Admitting: Family Medicine

## 2019-12-20 DIAGNOSIS — I10 Essential (primary) hypertension: Secondary | ICD-10-CM

## 2019-12-20 NOTE — Telephone Encounter (Signed)
Attempted to call patient to schedule follow up appointment- left message to call back- courtesy RF given #[phone]

## 2020-03-20 ENCOUNTER — Other Ambulatory Visit: Payer: Self-pay | Admitting: Family Medicine

## 2020-03-20 DIAGNOSIS — I10 Essential (primary) hypertension: Secondary | ICD-10-CM

## 2020-03-20 NOTE — Telephone Encounter (Signed)
Requested medication (s) are due for refill today: yes  Requested medication (s) are on the active medication list: yes  Last refill:  12/20/19 #180 0 refills  Future visit scheduled: yes on 04/07/20  Notes to clinic:  patient has 4 pills left. Per protocol to give courtesy refill the amount until next visit. Can patient get refill for #60 ?    Requested Prescriptions  Pending Prescriptions Disp Refills   lisinopril-hydrochlorothiazide (ZESTORETIC) 20-12.5 MG tablet [Pharmacy Med Name: LISINOPRIL-HCTZ 20-12.5 MG TAB] 180 tablet 0    Sig: TAKE TWO TABLETS BY MOUTH EVERY DAY      Cardiovascular:  ACEI + Diuretic Combos Failed - 03/20/2020  2:14 PM      Failed - Na in normal range and within 180 days    Sodium  Date Value Ref Range Status  04/21/2019 140 134 - 144 mmol/L Final  01/11/2013 135 (L) 136 - 145 mmol/L Final          Failed - K in normal range and within 180 days    Potassium  Date Value Ref Range Status  04/21/2019 3.9 3.5 - 5.2 mmol/L Final  01/11/2013 3.4 (L) 3.5 - 5.1 mmol/L Final          Failed - Cr in normal range and within 180 days    Creatinine  Date Value Ref Range Status  01/11/2013 0.92 0.60 - 1.30 mg/dL Final   Creatinine, Ser  Date Value Ref Range Status  04/21/2019 0.92 0.57 - 1.00 mg/dL Final          Failed - Ca in normal range and within 180 days    Calcium  Date Value Ref Range Status  04/21/2019 10.1 8.7 - 10.3 mg/dL Final   Calcium, Total  Date Value Ref Range Status  01/11/2013 8.4 (L) 8.5 - 10.1 mg/dL Final          Failed - Valid encounter within last 6 months    Recent Outpatient Visits           11 months ago Encounter for annual physical exam   [facility] [place], Dionne Bucy, MD   1 year ago Type 2 diabetes mellitus with diabetic polyneuropathy, without long-term current use of insulin [facility])   [facility] [place], Dionne Bucy, MD   1 year ago Encounter for annual physical exam    [facility] [place], Dionne Bucy, MD   2 years ago Type 2 diabetes mellitus with hyperglycemia, without long-term current use of insulin [facility])   [facility], Dionne Bucy, MD   3 years ago Encounter for [employer] annual wellness exam   [facility] [place], Dionne Bucy, MD                Passed - Patient is not pregnant      Passed - Last BP in normal range    BP Readings from Last 1 Encounters:  04/21/19 118/77

## 2020-04-07 ENCOUNTER — Ambulatory Visit (INDEPENDENT_AMBULATORY_CARE_PROVIDER_SITE_OTHER): Payer: PPO | Admitting: Family Medicine

## 2020-04-07 ENCOUNTER — Encounter: Payer: Self-pay | Admitting: Family Medicine

## 2020-04-07 VITALS — BP 132/78 | HR 60 | Temp 97.9°F | Wt 172.0 lb

## 2020-04-07 DIAGNOSIS — E669 Obesity, unspecified: Secondary | ICD-10-CM | POA: Diagnosis not present

## 2020-04-07 DIAGNOSIS — Z6831 Body mass index (BMI) 31.0-31.9, adult: Secondary | ICD-10-CM

## 2020-04-07 DIAGNOSIS — E785 Hyperlipidemia, unspecified: Secondary | ICD-10-CM | POA: Diagnosis not present

## 2020-04-07 DIAGNOSIS — E1159 Type 2 diabetes mellitus with other circulatory complications: Secondary | ICD-10-CM

## 2020-04-07 DIAGNOSIS — I152 Hypertension secondary to endocrine disorders: Secondary | ICD-10-CM | POA: Diagnosis not present

## 2020-04-07 DIAGNOSIS — E1169 Type 2 diabetes mellitus with other specified complication: Secondary | ICD-10-CM | POA: Diagnosis not present

## 2020-04-07 DIAGNOSIS — E1142 Type 2 diabetes mellitus with diabetic polyneuropathy: Secondary | ICD-10-CM | POA: Diagnosis not present

## 2020-04-07 MED ORDER — AMLODIPINE BESYLATE 10 MG PO TABS: 10.0000 mg | ORAL_TABLET | Freq: Every day | ORAL | 1 refills | Status: DC

## 2020-04-07 MED ORDER — LISINOPRIL-HYDROCHLOROTHIAZIDE 20-12.5 MG PO TABS: 2.0000 | ORAL_TABLET | Freq: Every day | ORAL | 1 refills | Status: DC

## 2020-04-07 MED ORDER — ATORVASTATIN CALCIUM 20 MG PO TABS: 20.0000 mg | ORAL_TABLET | Freq: Every day | ORAL | 1 refills | Status: DC

## 2020-04-07 MED ORDER — METFORMIN HCL 1000 MG PO TABS: 1000.0000 mg | ORAL_TABLET | Freq: Two times a day (BID) | ORAL | 1 refills | Status: DC

## 2020-04-07 NOTE — Assessment & Plan Note (Signed)
Discussed importance of healthy weight management Discussed diet and exercise  

## 2020-04-07 NOTE — Assessment & Plan Note (Signed)
Previously well controlled Continue statin Repeat FLP and CMP Goal LDL < 70 

## 2020-04-07 NOTE — Progress Notes (Signed)
Virtual telephone visit   Virtual Visit via Telephone Note   This visit type was conducted due to national recommendations for restrictions regarding the COVID-19 Pandemic (e.g. social distancing) in an effort to limit this patient's exposure and mitigate transmission in our community. Due to her co-morbid illnesses, this patient is at least at moderate risk for complications without adequate follow up. This format is felt to be most appropriate for this patient at this time. The patient did not have access to video technology or had technical difficulties with video requiring transitioning to audio format only (telephone). Physical exam was limited to content and character of the telephone converstion.     Patient location: home Provider location: [place] involved in the visit: patient, provider  I discussed the limitations of evaluation and management by telemedicine and the availability of in person appointments. The patient expressed understanding and agreed to proceed.   Visit Date: 04/07/2020  Today's healthcare provider: Lavon Paganini, MD   Chief Complaint  Patient presents with  . Diabetes  . Hypertension   Subjective    HPI  Diabetes Mellitus Type II, Follow-up  Lab Results  Component Value Date   HGBA1C 6.6 (H) 04/21/2019   HGBA1C 6.8 (A) 10/14/2018   HGBA1C 7.0 (H) 04/08/2018   Wt Readings from Last 3 Encounters:  04/07/20 172 lb (78 kg)  04/21/19 166 lb (75.3 kg)  10/14/18 176 lb 6.4 oz (80 kg)   Last seen for diabetes 1 years ago.  Management since then includes no changes. She reports excellent compliance with treatment. She is not having side effects.  Symptoms: No fatigue No foot ulcerations  No appetite changes No nausea  Yes paresthesia of the feet  No polydipsia  No polyuria No visual disturbances   No vomiting     Home blood sugar records: fasting range: 120  Episodes of hypoglycemia? No    Current insulin  regiment: none Most Recent Eye Exam: UTD Current exercise: walking Current diet habits: in general, a "healthy" diet    Pertinent Labs: Lab Results  Component Value Date   CHOL 151 04/21/2019   HDL 53 04/21/2019   LDLCALC 76 04/21/2019   TRIG 121 04/21/2019   CHOLHDL 2.8 04/21/2019   Lab Results  Component Value Date   NA 140 04/21/2019   K 3.9 04/21/2019   CREATININE 0.92 04/21/2019   GFRNONAA 63 04/21/2019   GFRAA 73 04/21/2019   GLUCOSE 135 (H) 04/21/2019     --------------------------------------------------------------------------------------------------- Hypertension, follow-up  BP Readings from Last 3 Encounters:  04/07/20 132/78  04/21/19 118/77  10/14/18 130/68   Wt Readings from Last 3 Encounters:  04/07/20 172 lb (78 kg)  04/21/19 166 lb (75.3 kg)  10/14/18 176 lb 6.4 oz (80 kg)     She was last seen for hypertension 1 years ago.  BP at that visit was 118/77. Management since that visit includes no changes.  She reports excellent compliance with treatment. She is not having side effects.  She is following a Regular diet. She is exercising. She does not smoke.  Use of agents associated with hypertension: none.   Outside blood pressures are stable. Symptoms: No chest pain No chest pressure  No palpitations No syncope  No dyspnea No orthopnea  No paroxysmal nocturnal dyspnea No lower extremity edema   Pertinent labs: Lab Results  Component Value Date   CHOL 151 04/21/2019   HDL 53 04/21/2019   LDLCALC 76 04/21/2019   TRIG 121  04/21/2019   CHOLHDL 2.8 04/21/2019   Lab Results  Component Value Date   NA 140 04/21/2019   K 3.9 04/21/2019   CREATININE 0.92 04/21/2019   GFRNONAA 63 04/21/2019   GFRAA 73 04/21/2019   GLUCOSE 135 (H) 04/21/2019     The 10-year ASCVD risk score Mikey Bussing DC Jr., et al., 2013) is: 25.1%   ---------------------------------------------------------------------------------------------------   Patient Active Problem  List   Diagnosis Date Noted  . Osteopenia 12/18/2016  . 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 Obesity 09/11/2016  . Diabetic neuropathy ([place]) 11/29/2015  . Headache 11/17/2014  . Hemangioma of liver 08/31/2014  . Benign neoplasm of kidney 06/21/2014  . Diabetes mellitus, type 2 ([place]) 06/21/2014  . Hypertension associated with diabetes ([place]) 06/21/2014  . Blood in the urine 06/21/2014  . Hyperlipidemia associated with type 2 diabetes mellitus ([place]) 06/21/2014  . Burning or prickling sensation 06/21/2014  . Benign melanoma 06/21/2014  . Fibroid 06/21/2014  . Avitaminosis D 06/21/2014  . 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 Smoking status: Never Smoker  . Smokeless tobacco: Never Used  Vaping Use  . Vaping Use: Never used  Substance Use Topics  . Alcohol use: Not Currently  . Drug use: No   Allergies  Allergen Reactions  . Phosphate Laxative  [Sodium Phosphate]     Phosphosoda- Bad stomach cramps  . Ceclor  [Cefaclor] Rash  . Penicillins Rash      Medications: Outpatient Medications Prior to Visit  Medication Sig  . acetaminophen (TYLENOL) 500 MG tablet Take 500 mg by mouth every 6 (six) hours as needed.  . Coenzyme Q10 (COQ10) 200 MG CAPS Take 1 capsule by mouth daily. (Patient taking differently: Take 100 mg by mouth daily.)  . glucose blood test strip RELION CONFIRM/MICRO TEST (In Vitro Strip)  1 (one) Strip Strip daily for 0 days  Quantity: 100;  Refills: 3   Ordered :29-July-2012  [name] ;  Started 29-July-2012 Active  . Multiple Vitamins-Minerals (MULTIVITAMIN ADULTS PO) Take by mouth daily.   . Omega-3 Fatty Acids (FISH OIL) 1000 MG CAPS Take by mouth. Reported on 02/22/2015  . [DISCONTINUED] amLODipine (NORVASC) 10 MG tablet Take 1 tablet (10 mg total) by mouth daily. Please schedule an office visit before anymore refills.  . [DISCONTINUED] atorvastatin (LIPITOR) 20 MG tablet TAKE 1 TABLET BY MOUTH DAILY  .  [DISCONTINUED] lisinopril-hydrochlorothiazide (ZESTORETIC) 20-12.5 MG tablet TAKE TWO TABLETS BY MOUTH EVERY DAY  . [DISCONTINUED] metFORMIN (GLUCOPHAGE) 1000 MG tablet TAKE 1 TABLET BY MOUTH TWICE DAILY  . [DISCONTINUED] ASPIRIN ADULT LOW STRENGTH PO Take 81 mg by mouth daily.  (Patient not taking: Reported on 04/07/2020)   No facility-administered medications prior to visit.    Review of Systems  Constitutional: Negative for appetite change, chills, fatigue and unexpected weight change.  Eyes: Negative for visual disturbance.  Respiratory: Negative for cough, chest tightness and shortness of breath.   Cardiovascular: Negative for chest pain, palpitations and leg swelling.  Gastrointestinal: Negative for abdominal pain.  Endocrine: Negative for polydipsia, polyphagia and polyuria.    Last CBC Lab Results  Component Value Date   WBC CANCELED 12/18/2016   HGB 13.0 12/18/2016   HCT 39.9 12/18/2016   MCV 74.3 (L) 12/18/2016   MCH 24.2 (L) 12/18/2016   RDW 14.8 12/18/2016   PLT 284 12/18/2016   Last thyroid functions Lab Results  Component Value Date   TSH 2.45 07/14/2013  Last vitamin D Lab Results  Component Value Date   VD25OH 37.3 04/21/2019   Last vitamin B12 and Folate Lab Results  Component Value Date   VITAMINB12 480 04/21/2019      Objective    BP 132/78   Pulse 60   Temp 97.9 F (36.6 C)   Wt 172 lb (78 kg)   BMI 31.46 kg/m  BP Readings from Last 3 Encounters:  04/07/20 132/78  04/21/19 118/77  10/14/18 130/68   Wt Readings from Last 3 Encounters:  04/07/20 172 lb (78 kg)  04/21/19 166 lb (75.3 kg)  10/14/18 176 lb 6.4 oz (80 kg)     Speaking in full sentences in NAD   Assessment & Plan     Problem List Items Addressed This Visit      Cardiovascular and Mediastinum   Hypertension associated with diabetes ([place])    Well controlled Continue current medications Recheck metabolic panel F/u in 6 months       Relevant Medications    amLODipine (NORVASC) 10 MG tablet   atorvastatin (LIPITOR) 20 MG tablet   lisinopril-hydrochlorothiazide (ZESTORETIC) 20-12.5 MG tablet   metFORMIN (GLUCOPHAGE) 1000 MG tablet   Other Relevant Orders   Comprehensive metabolic panel     Endocrine   Diabetes mellitus, type 2 ([place]) - Primary    Well controlled previously Associated with HTN and HLD Continue current meds Recheck A1c On ACEi and statin Foot exam at next in person visit [facility] for last eye exam UTD on vaccines F/u in 6 months      Relevant Medications   atorvastatin (LIPITOR) 20 MG tablet   lisinopril-hydrochlorothiazide (ZESTORETIC) 20-12.5 MG tablet   metFORMIN (GLUCOPHAGE) 1000 MG tablet   Other Relevant Orders   Hemoglobin A1c   Hyperlipidemia associated with type 2 diabetes mellitus (HCC)    Previously well controlled Continue statin Repeat FLP and CMP Goal LDL < 70      Relevant Medications   atorvastatin (LIPITOR) 20 MG tablet   lisinopril-hydrochlorothiazide (ZESTORETIC) 20-12.5 MG tablet   metFORMIN (GLUCOPHAGE) 1000 MG tablet   Other Relevant Orders   Comprehensive metabolic panel   Lipid panel   Diabetic neuropathy (HCC)    Chronic and mild  Not on meds Continue to monitor      Relevant Medications   atorvastatin (LIPITOR) 20 MG tablet   lisinopril-hydrochlorothiazide (ZESTORETIC) 20-12.5 MG tablet   metFORMIN (GLUCOPHAGE) 1000 MG tablet     Other   Obesity    Discussed importance of healthy weight management Discussed diet and exercise       Relevant Medications   metFORMIN (GLUCOPHAGE) 1000 MG tablet       Return in about 6 months (around 10/08/2020) for CPE.    I discussed the assessment and treatment plan with the patient. The patient was provided an opportunity to ask questions and all were answered. The patient agreed with the plan and demonstrated an understanding of the instructions.   The patient was advised to call back or seek an in-person evaluation if the symptoms  worsen or if the condition fails to improve as anticipated.  I provided 22 minutes of non-face-to-face time during this encounter.  I, Lavon Paganini, MD, have reviewed all documentation for this visit. The documentation on 04/07/20 for the exam, diagnosis, procedures, and orders are all accurate and complete.   Otillia Cordone, Dionne Bucy, MD, MPH [facility]

## 2020-04-07 NOTE — Assessment & Plan Note (Signed)
Well controlled Continue current medications Recheck metabolic panel F/u in 6 months  

## 2020-04-07 NOTE — Assessment & Plan Note (Signed)
Well controlled previously Associated with HTN and HLD Continue current meds Recheck A1c On ACEi and statin Foot exam at next in person visit [facility] for last eye exam UTD on vaccines F/u in 6 months

## 2020-04-07 NOTE — Assessment & Plan Note (Signed)
Chronic and mild  Not on meds Continue to monitor

## 2020-04-10 DIAGNOSIS — E785 Hyperlipidemia, unspecified: Secondary | ICD-10-CM | POA: Diagnosis not present

## 2020-04-10 DIAGNOSIS — I152 Hypertension secondary to endocrine disorders: Secondary | ICD-10-CM | POA: Diagnosis not present

## 2020-04-10 DIAGNOSIS — E1169 Type 2 diabetes mellitus with other specified complication: Secondary | ICD-10-CM | POA: Diagnosis not present

## 2020-04-10 DIAGNOSIS — E1159 Type 2 diabetes mellitus with other circulatory complications: Secondary | ICD-10-CM | POA: Diagnosis not present

## 2020-04-10 DIAGNOSIS — E1142 Type 2 diabetes mellitus with diabetic polyneuropathy: Secondary | ICD-10-CM | POA: Diagnosis not present

## 2020-04-11 ENCOUNTER — Telehealth: Payer: Self-pay

## 2020-04-11 LAB — COMPREHENSIVE METABOLIC PANEL
ALT: 15 IU/L (ref 0–32)
AST: 19 IU/L (ref 0–40)
Albumin/Globulin Ratio: 2.2 (ref 1.2–2.2)
Albumin: 4.6 g/dL (ref 3.7–4.7)
Alkaline Phosphatase: 46 IU/L (ref 44–121)
BUN/Creatinine Ratio: 25 (ref 12–28)
BUN: 24 mg/dL (ref 8–27)
Bilirubin Total: 0.4 mg/dL (ref 0.0–1.2)
CO2: 22 mmol/L (ref 20–29)
Calcium: 9.9 mg/dL (ref 8.7–10.3)
Chloride: 97 mmol/L (ref 96–106)
Creatinine, Ser: 0.95 mg/dL (ref 0.57–1.00)
Globulin, Total: 2.1 g/dL (ref 1.5–4.5)
Glucose: 135 mg/dL — ABNORMAL HIGH (ref 65–99)
Potassium: 3.7 mmol/L (ref 3.5–5.2)
Sodium: 138 mmol/L (ref 134–144)
Total Protein: 6.7 g/dL (ref 6.0–8.5)
eGFR: 64 mL/min/{1.73_m2} (ref >59)

## 2020-04-11 LAB — LIPID PANEL
Chol/HDL Ratio: 2.7 ratio (ref 0.0–4.4)
Cholesterol, Total: 146 mg/dL (ref 100–199)
HDL: 54 mg/dL (ref >39)
LDL Chol Calc (NIH): 75 mg/dL (ref 0–99)
Triglycerides: 89 mg/dL (ref 0–149)
VLDL Cholesterol Cal: 17 mg/dL (ref 5–40)

## 2020-04-11 LAB — HEMOGLOBIN A1C
Est. average glucose Bld gHb Est-mCnc: 140 mg/dL
Hgb A1c MFr Bld: 6.5 % — ABNORMAL HIGH (ref 4.8–5.6)

## 2020-04-11 NOTE — Telephone Encounter (Signed)
Left message advising pt.  (Per DPR)  Thanks,   -Maki Hege  

## 2020-04-11 NOTE — Telephone Encounter (Signed)
-----   Message from [place] Crews, MD sent at 04/11/2020 10:08 AM EST ----- Normal labs

## 2020-04-19 NOTE — Progress Notes (Signed)
Subjective:   Karen Olsen is a 70 y.o. female who presents for Medicare Annual (Subsequent) preventive examination.  I connected with Karen Ways today by telephone and verified that I am speaking with the correct person using two identifiers. Location patient: home Location provider: work Persons participating in the virtual visit: patient, provider.   I discussed the limitations, risks, security and privacy concerns of performing an evaluation and management service by telephone and the availability of in person appointments. I also discussed with the patient that there may be a patient responsible charge related to this service. The patient expressed understanding and verbally consented to this telephonic visit.    Interactive audio and video telecommunications were attempted between this provider and patient, however failed, due to patient having technical difficulties OR patient did not have access to video capability.  We continued and completed visit with audio only.   Review of Systems    N/A  Cardiac Risk Factors include: advanced age (>22men, >4 women);diabetes mellitus;dyslipidemia;hypertension;obesity (BMI >30kg/m2)     Objective:    There were no vitals filed for this visit. There is no height or weight on file to calculate BMI.  Advanced Directives 04/24/2020 04/13/2019 04/08/2018 11/29/2015 02/22/2015 08/31/2014  Does Patient Have a Medical Advance Directive? Yes Yes Yes No Yes Yes  Type of Paramedic of [place];Living will [place];Living will [place];Living will - Living will;Healthcare Power of Attorney Living will;Healthcare Power of Attorney  Copy of [place] in Chart? No - copy requested No - copy requested Yes - validated most recent copy scanned in chart (See row information) - - -    Current Medications (verified) Outpatient Encounter Medications as of 04/24/2020  Medication  Sig  . acetaminophen (TYLENOL) 500 MG tablet Take 500 mg by mouth every 6 (six) hours as needed.  Marland Kitchen amLODipine (NORVASC) 10 MG tablet Take 1 tablet (10 mg total) by mouth daily.  Marland Kitchen atorvastatin (LIPITOR) 20 MG tablet Take 1 tablet (20 mg total) by mouth daily.  . Coenzyme Q10 (COQ10) 200 MG CAPS Take 1 capsule by mouth daily. (Patient taking differently: Take 100 mg by mouth daily.)  . glucose blood test strip RELION CONFIRM/MICRO TEST (In Vitro Strip)  1 (one) Strip Strip daily for 0 days  Quantity: 100;  Refills: 3   Ordered :29-July-2012  [name] ;  Started 29-July-2012 Active  . lisinopril-hydrochlorothiazide (ZESTORETIC) 20-12.5 MG tablet Take 2 tablets by mouth daily.  . metFORMIN (GLUCOPHAGE) 1000 MG tablet Take 1 tablet (1,000 mg total) by mouth 2 (two) times daily.  . Multiple Vitamins-Minerals (MULTIVITAMIN ADULTS PO) Take by mouth daily.   . Omega-3 Fatty Acids (FISH OIL) 1000 MG CAPS Take by mouth. Reported on 02/22/2015   No facility-administered encounter medications on file as of 04/24/2020.    Allergies (verified) Phosphate laxative  [sodium phosphate], Ceclor  [cefaclor], and Penicillins   History: Past Medical History:  Diagnosis Date  . Diabetes mellitus without complication ([place])   . Hyperlipidemia   . Hypertension    Past Surgical History:  Procedure Laterality Date  . HERNIA REPAIR  01/2013   Dr. Burt Knack  . LIPOMA EXCISION     Removed from back.  . 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 RENAL CYST EXCISION Right   . SKIN CANCER EXCISION    . UTERINE FIBROID SURGERY     Family History  Problem Relation Age of Onset  .  Stroke Mother   . Prostate cancer Father   . Congestive Heart Failure Father   . Stroke Maternal Grandmother   . Stroke Maternal Grandfather   . Alzheimer's disease Maternal Grandfather   . Cancer Paternal Grandmother   . Cancer Paternal Grandfather   . Breast cancer Neg Hx    Social History   Socioeconomic  History  . Marital status: Married    Spouse name: Balinda Quails  . Number of children: 1  . Years of education: 14  . Highest education level: Associate degree: occupational, Hotel manager, or vocational program  Occupational History    Employer: OTHER    Comment: Adult nurse  Tobacco Use  . Smoking status: Never Smoker  . Smokeless tobacco: Never Used  Vaping Use  . Vaping Use: Never used  Substance and Sexual Activity  . Alcohol use: Not Currently  . Drug use: No  . Sexual activity: Not Currently  Other Topics Concern  . Not on file  Social History Narrative  . Not on file   Social Determinants of Health   Financial Resource Strain: Low Risk   . Difficulty of Paying Living Expenses: Not hard at all  Food Insecurity: No Food Insecurity  . Worried About Charity fundraiser in the Last Year: Never true  . Ran Out of Food in the Last Year: Never true  Transportation Needs: No Transportation Needs  . Lack of Transportation (Medical): No  . Lack of Transportation (Non-Medical): No  Physical Activity: Sufficiently Active  . Days of Exercise per Week: 7 days  . Minutes of Exercise per Session: 30 min  Stress: No Stress Concern Present  . Feeling of Stress : Not at all  Social Connections: Moderately Isolated  . Frequency of Communication with Friends and Family: More than three times a week  . Frequency of Social Gatherings with Friends and Family: More than three times a week  . Attends Religious Services: Never  . Active Member of Clubs or Organizations: No  . Attends Archivist Meetings: Never  . Marital Status: Married    Tobacco Counseling Counseling given: Not Answered   Clinical Intake:  Pre-visit preparation completed: Yes  Pain : No/denies pain     Nutritional Risks: None Diabetes: Yes  How often do you need to have someone help you when you read instructions, pamphlets, or other written materials from your doctor or pharmacy?: 1 -  Never  Diabetic? Yes  Nutrition Risk Assessment:  Has the patient had any N/V/D within the last 2 months?  No  Does the patient have any non-healing wounds?  No  Has the patient had any unintentional weight loss or weight gain?  No   Diabetes:  Is the patient diabetic?  Yes  If diabetic, was a CBG obtained today?  No  Did the patient bring in their glucometer from home?  No  How often do you monitor your CBG's? None to several times daily depending on diet.   Financial Strains and Diabetes Management:  Are you having any financial strains with the device, your supplies or your medication? No .  Does the patient want to be seen by Chronic Care Management for management of their diabetes?  No  Would the patient like to be referred to a Nutritionist or for Diabetic Management?  No   Diabetic Exams:  Diabetic Eye Exam: Completed 09/22/19. Diabetic Foot Exam: Overdue, Pt has been advised about the importance in completing this exam.    Interpreter Needed?:  No  Information entered by :: [facility], LPN   Activities of Daily Living In your present state of health, do you have any difficulty performing the following activities: 04/24/2020  Hearing? N  Vision? N  Difficulty concentrating or making decisions? N  Walking or climbing stairs? N  Dressing or bathing? N  Doing errands, shopping? N  Preparing Food and eating ? N  Using the Toilet? N  In the past six months, have you accidently leaked urine? N  Do you have problems with loss of bowel control? N  Managing your Medications? N  Managing your Finances? N  Housekeeping or managing your Housekeeping? N  Some recent data might be hidden    Patient Care Team: Virginia Crews, MD as PCP - General (Family Medicine) Anell Barr, OD (Optometry)  Indicate any recent Medical Services you may have received from other than [facility] providers in the past year (date may be approximate).     Assessment:   This is a routine  wellness examination for Karen.  Hearing/Vision screen No exam data present  Dietary issues and exercise activities discussed: Current Exercise Habits: Home exercise routine, Type of exercise: walking, Time (Minutes): 30, Frequency (Times/Week): 7, Weekly Exercise (Minutes/Week): 210, Intensity: Mild, Exercise limited by: None identified  Goals    . Have 3 meals a day     Recommend to decrease portion sizes by eating 3 small healthy meals and at least 2 healthy snacks per day.      Depression Screen PHQ 2/9 Scores 04/24/2020 04/21/2019 04/13/2019 04/08/2018 04/08/2018 12/18/2016 11/29/2015  PHQ - 2 Score 0 1 1 0 0 0 0  PHQ- 9 Score - 2 - 0 - - -    Fall Risk Fall Risk  04/24/2020 04/21/2019 04/13/2019 04/08/2018 12/18/2016  Falls in the past year? 0 0 0 0 No  Number falls in past yr: 0 0 0 - -  Injury with Fall? 0 0 0 - -  Follow up - Falls evaluation completed - - -    FALL RISK PREVENTION PERTAINING TO THE HOME:  Any stairs in or around the home? Yes  If so, are there any without handrails? No  Home free of loose throw rugs in walkways, pet beds, electrical cords, etc? Yes  Adequate lighting in your home to reduce risk of falls? Yes   ASSISTIVE DEVICES UTILIZED TO PREVENT FALLS:  Life alert? No  Use of a cane, walker or w/c? No  Grab bars in the bathroom? Yes  Shower chair or bench in shower? No  Elevated toilet seat or a handicapped toilet? Yes    Cognitive Function: Normal cognitive status assessed by direct observation by this Nurse Health Advisor. No abnormalities found.      6CIT Screen 04/08/2018  What Year? 0 points  What month? 0 points  What time? 0 points  Count back from 20 0 points  Months in reverse 0 points  Repeat phrase 0 points  Total Score 0    Immunizations Immunization History  Administered Date(s) Administered  . Fluad Quad(high Dose 65+) 10/14/2018  . Influenza Split 11/30/2005, 10/14/2009, 12/18/2011  . Influenza, High Dose Seasonal PF  11/30/2014, 11/29/2015, 11/02/2016, 11/29/2017  . Influenza,inj,Quad PF,6+ Mos 11/04/2012, 10/13/2013  . PFIZER(Purple Top)SARS-COV-2 Vaccination 03/15/2019, 04/08/2019  . Pneumococcal Conjugate-13 01/05/2014  . Pneumococcal Polysaccharide-23 12/01/2004, 11/29/2015  . Td 12/26/1995, 12/18/2011  . Tdap 12/18/2011    TDAP status: Up to date  Flu Vaccine status: Declined, Education has been provided regarding  the importance of this vaccine but patient still declined. Advised may receive this vaccine at local pharmacy or Health Dept. Aware to provide a copy of the vaccination record if obtained from local pharmacy or Health Dept. Verbalized acceptance and understanding.  Pneumococcal vaccine status: Up to date  Covid-19 vaccine status: Completed vaccines  Qualifies for Shingles Vaccine? Yes   Zostavax completed No   Shingrix Completed?: No.    Education has been provided regarding the importance of this vaccine. Patient has been advised to call insurance company to determine out of pocket expense if they have not yet received this vaccine. Advised may also receive vaccine at local pharmacy or Health Dept. Verbalized acceptance and understanding.  Screening Tests Health Maintenance  Topic Date Due  . COLONOSCOPY (Pts 45-38yrs Insurance coverage will need to be confirmed)  Never done  . MAMMOGRAM  03/06/2019  . COVID-19 Vaccine (3 - Pfizer risk 4-dose series) 05/06/2019  . COLON CANCER SCREENING ANNUAL FOBT  11/04/2019  . FOOT EXAM  04/20/2020  . INFLUENZA VACCINE  05/04/2020 (Originally 09/05/2019)  . OPHTHALMOLOGY EXAM  09/21/2020  . HEMOGLOBIN A1C  10/11/2020  . TETANUS/TDAP  12/17/2021  . DEXA SCAN  03/05/2022  . Hepatitis C Screening  Completed  . PNA vac Low Risk Adult  Completed  . HPV VACCINES  Aged Out    Health Maintenance  Health Maintenance Due  Topic Date Due  . COLONOSCOPY (Pts 45-31yrs Insurance coverage will need to be confirmed)  Never done  . MAMMOGRAM   03/06/2019  . COVID-19 Vaccine (3 - Pfizer risk 4-dose series) 05/06/2019  . COLON CANCER SCREENING ANNUAL FOBT  11/04/2019  . FOOT EXAM  04/20/2020    Colorectal cancer screening: Currently due, declined colonoscopy referral or cologuard order at this time. Pt agreed to have the FOBT test at next in office apt.   Mammogram status: Currently due, declined order at this time.   Bone Density status: Completed 03/05/17. Results reflect: Bone density results: OSTEOPENIA. Repeat every 5 years.  Lung Cancer Screening: (Low Dose CT Chest recommended if Age 54-80 years, 30 pack-year currently smoking OR have quit w/in 15years.) does not qualify.   Additional Screening:  Hepatitis C Screening: Up to date  Vision Screening: Recommended annual ophthalmology exams for early detection of glaucoma and other disorders of the eye. Is the patient up to date with their annual eye exam?  Yes  Who is the provider or what is the name of the office in which the patient attends annual eye exams? Dr Ellin Mayhew If pt is not established with a provider, would they like to be referred to a provider to establish care? No .   Dental Screening: Recommended annual dental exams for proper oral hygiene  Community Resource Referral / Chronic Care Management: CRR required this visit?  No   CCM required this visit?  No      Plan:     I have personally reviewed and noted the following in the patient's chart:   . Medical and social history . Use of alcohol, tobacco or illicit drugs  . Current medications and supplements . Functional ability and status . Nutritional status . Physical activity . Advanced directives . List of other physicians . Hospitalizations, surgeries, and ER visits in previous 12 months . Vitals . Screenings to include cognitive, depression, and falls . Referrals and appointments  In addition, I have reviewed and discussed with patient certain preventive protocols, quality metrics, and  best practice recommendations. A written  personalized care plan for preventive services as well as general preventive health recommendations were provided to patient.     Avory Rahimi [place], [place]   05/17/6436   Nurse Notes: Pt would like to receive the Covid booster and FOBT screening at next in office apt. Declined a colonoscopy referral, mammogram order or cologuard order at this time.

## 2020-04-24 ENCOUNTER — Ambulatory Visit (INDEPENDENT_AMBULATORY_CARE_PROVIDER_SITE_OTHER): Payer: PPO

## 2020-04-24 ENCOUNTER — Other Ambulatory Visit: Payer: Self-pay

## 2020-04-24 DIAGNOSIS — Z0000 Encounter for general adult medical examination without abnormal findings: Secondary | ICD-10-CM | POA: Diagnosis not present

## 2020-04-24 NOTE — Patient Instructions (Signed)
Karen Olsen , Thank you for taking time to come for your Medicare Wellness Visit. I appreciate your ongoing commitment to your health goals. Please review the following plan we discussed and let me know if I can assist you in the future.   Screening recommendations/referrals: Colonoscopy: Currently due, declined colonoscopy referral or cologuard order at this time. Mammogram: Currently due, declined order at this time. Bone Density: Up to date, due 02/2022 Recommended yearly ophthalmology/optometry visit for glaucoma screening and checkup Recommended yearly dental visit for hygiene and checkup  Vaccinations: Influenza vaccine: Currently due, declined receiving. Pneumococcal vaccine: Completed series Tdap vaccine: Up to date, due 12/2021 Shingles vaccine: Shingrix discussed. Please contact your pharmacy for coverage information.     Advanced directives: Please bring a copy of your POA (Power of Attorney) and/or Living Will to your next appointment.   Conditions/risks identified: Recommend to decrease portion sizes by eating 3 small healthy meals and at least 2 healthy snacks per day.  Next appointment: None, declined scheduling a follow up with PCP or an AWV for 2023 at this time.    Preventive Care 72 Years and Older, Female Preventive care refers to lifestyle choices and visits with your health care provider that can promote health and wellness. What does preventive care include?  A yearly physical exam. This is also called an annual well check.  Dental exams once or twice a year.  Routine eye exams. Ask your health care provider how often you should have your eyes checked.  Personal lifestyle choices, including:  Daily care of your teeth and gums.  Regular physical activity.  Eating a healthy diet.  Avoiding tobacco and drug use.  Limiting alcohol use.  Practicing safe sex.  Taking low-dose aspirin every day.  Taking vitamin and mineral supplements as recommended by your  health care provider. What happens during an annual well check? The services and screenings done by your health care provider during your annual well check will depend on your age, overall health, lifestyle risk factors, and family history of disease. Counseling  Your health care provider may ask you questions about your:  Alcohol use.  Tobacco use.  Drug use.  Emotional well-being.  Home and relationship well-being.  Sexual activity.  Eating habits.  History of falls.  Memory and ability to understand (cognition).  Work and work Statistician.  Reproductive health. Screening  You may have the following tests or measurements:  Height, weight, and BMI.  Blood pressure.  Lipid and cholesterol levels. These may be checked every 5 years, or more frequently if you are over 65 years old.  Skin check.  Lung cancer screening. You may have this screening every year starting at age 72 if you have a 30-pack-year history of smoking and currently smoke or have quit within the past 15 years.  Fecal occult blood test (FOBT) of the stool. You may have this test every year starting at age 72.  Flexible sigmoidoscopy or colonoscopy. You may have a sigmoidoscopy every 5 years or a colonoscopy every 10 years starting at age 75.  Hepatitis C blood test.  Hepatitis B blood test.  Sexually transmitted disease (STD) testing.  Diabetes screening. This is done by checking your blood sugar (glucose) after you have not eaten for a while (fasting). You may have this done every 1-3 years.  Bone density scan. This is done to screen for osteoporosis. You may have this done starting at age 72.  Mammogram. This may be done every 1-2 years. Talk  to your health care provider about how often you should have regular mammograms. Talk with your health care provider about your test results, treatment options, and if necessary, the need for more tests. Vaccines  Your health care provider may recommend  certain vaccines, such as:  Influenza vaccine. This is recommended every year.  Tetanus, diphtheria, and acellular pertussis (Tdap, Td) vaccine. You may need a Td booster every 10 years.  Zoster vaccine. You may need this after age 72.  Pneumococcal 13-valent conjugate (PCV13) vaccine. One dose is recommended after age 72.  Pneumococcal polysaccharide (PPSV23) vaccine. One dose is recommended after age 72. Talk to your health care provider about which screenings and vaccines you need and how often you need them. This information is not intended to replace advice given to you by your health care provider. Make sure you discuss any questions you have with your health care provider. Document Released: 02/17/2015 Document Revised: 10/11/2015 Document Reviewed: 11/22/2014 Elsevier Interactive Patient Education  2017 [employer] Prevention in the Home Falls can cause injuries. They can happen to people of all ages. There are many things you can do to make your home safe and to help prevent falls. What can I do on the outside of my home?  Regularly fix the edges of walkways and driveways and fix any cracks.  Remove anything that might make you trip as you walk through a door, such as a raised step or threshold.  Trim any bushes or trees on the path to your home.  Use bright outdoor lighting.  Clear any walking paths of anything that might make someone trip, such as rocks or tools.  Regularly check to see if handrails are loose or broken. Make sure that both sides of any steps have handrails.  Any raised decks and porches should have guardrails on the edges.  Have any leaves, snow, or ice cleared regularly.  Use sand or salt on walking paths during winter.  Clean up any spills in your garage right away. This includes oil or grease spills. What can I do in the bathroom?  Use night lights.  Install grab bars by the toilet and in the tub and shower. Do not use towel bars as  grab bars.  Use non-skid mats or decals in the tub or shower.  If you need to sit down in the shower, use a plastic, non-slip stool.  Keep the floor dry. Clean up any water that spills on the floor as soon as it happens.  Remove soap buildup in the tub or shower regularly.  Attach bath mats securely with double-sided non-slip rug tape.  Do not have throw rugs and other things on the floor that can make you trip. What can I do in the bedroom?  Use night lights.  Make sure that you have a light by your bed that is easy to reach.  Do not use any sheets or blankets that are too big for your bed. They should not hang down onto the floor.  Have a firm chair that has side arms. You can use this for support while you get dressed.  Do not have throw rugs and other things on the floor that can make you trip. What can I do in the kitchen?  Clean up any spills right away.  Avoid walking on wet floors.  Keep items that you use a lot in easy-to-reach places.  If you need to reach something above you, use a strong step stool that  has a grab bar.  Keep electrical cords out of the [place].  Do not use floor polish or wax that makes floors slippery. If you must use wax, use non-skid floor wax.  Do not have throw rugs and other things on the floor that can make you trip. What can I do with my stairs?  Do not leave any items on the stairs.  Make sure that there are handrails on both sides of the stairs and use them. Fix handrails that are broken or loose. Make sure that handrails are as long as the stairways.  Check any carpeting to make sure that it is firmly attached to the stairs. Fix any carpet that is loose or worn.  Avoid having throw rugs at the top or bottom of the stairs. If you do have throw rugs, attach them to the floor with carpet tape.  Make sure that you have a light switch at the top of the stairs and the bottom of the stairs. If you do not have them, ask someone to add them  for you. What else can I do to help prevent falls?  Wear shoes that:  Do not have high heels.  Have rubber bottoms.  Are comfortable and fit you well.  Are closed at the toe. Do not wear sandals.  If you use a stepladder:  Make sure that it is fully opened. Do not climb a closed stepladder.  Make sure that both sides of the stepladder are locked into place.  Ask someone to hold it for you, if possible.  Clearly mark and make sure that you can see:  Any grab bars or handrails.  First and last steps.  Where the edge of each step is.  Use tools that help you move around (mobility aids) if they are needed. These include:  Canes.  Walkers.  Scooters.  Crutches.  Turn on the lights when you go into a dark area. Replace any light bulbs as soon as they burn out.  Set up your furniture so you have a clear path. Avoid moving your furniture around.  If any of your floors are uneven, fix them.  If there are any pets around you, be aware of where they are.  Review your medicines with your doctor. Some medicines can make you feel dizzy. This can increase your chance of falling. Ask your doctor what other things that you can do to help prevent falls. This information is not intended to replace advice given to you by your health care provider. Make sure you discuss any questions you have with your health care provider. Document Released: 11/17/2008 Document Revised: 06/29/2015 Document Reviewed: 02/25/2014 Elsevier Interactive Patient Education  2017 [employer].

## 2020-08-28 ENCOUNTER — Other Ambulatory Visit: Payer: Self-pay | Admitting: Family Medicine

## 2020-10-04 ENCOUNTER — Other Ambulatory Visit: Payer: Self-pay | Admitting: Family Medicine

## 2020-10-04 NOTE — Telephone Encounter (Signed)
Attempted to call patient- left message on cell number and message on Rx- please call office for 6 month follow up. Courtesy 30 day refill given

## 2020-11-06 ENCOUNTER — Other Ambulatory Visit: Payer: Self-pay | Admitting: Family Medicine

## 2020-11-07 NOTE — Telephone Encounter (Signed)
Appointment 11/30/20.

## 2020-11-22 ENCOUNTER — Other Ambulatory Visit: Payer: Self-pay | Admitting: Family Medicine

## 2020-11-22 NOTE — Telephone Encounter (Signed)
Courtesy refill . Future visit in 1 week.

## 2020-11-27 ENCOUNTER — Other Ambulatory Visit: Payer: Self-pay | Admitting: Family Medicine

## 2020-11-27 NOTE — Telephone Encounter (Signed)
Medication Refill - Medication: amLODipine (NORVASC) 10 MG tablet  atorvastatin (LIPITOR) 20 MG tablet   Pt called to report that she is almost out of this Rx. She is scheduled to see her PCP (next available)   Has the patient contacted their pharmacy? Yes.   (Agent: If no, request that the patient contact the pharmacy for the refill. If patient does not wish to contact the pharmacy document the reason why and proceed with request.) (Agent: If yes, when and what did the pharmacy advise?)  Preferred Pharmacy (with phone number or street name):  [place], [place] - [address]  [place] [address]  Phone: [phone] Fax: [phone]   Has the patient been seen for an appointment in the last year OR does the patient have an upcoming appointment? Yes.    Agent: Please be advised that RX refills may take up to 3 business days. We ask that you follow-up with your pharmacy.

## 2020-11-28 MED ORDER — ATORVASTATIN CALCIUM 20 MG PO TABS
20.0000 mg | ORAL_TABLET | Freq: Every day | ORAL | 0 refills | Status: DC
Start: 2020-11-28 — End: 2021-05-21

## 2020-11-28 MED ORDER — AMLODIPINE BESYLATE 10 MG PO TABS: ORAL_TABLET | ORAL | 0 refills | Status: DC

## 2020-11-28 NOTE — Telephone Encounter (Signed)
Requested medication (s) are due for refill today:   Yes for amlodipine, No for atorvastatin  Requested medication (s) are on the active medication list:   Yes for both  Future visit scheduled:   Yes on 02/13/2021 with Dr. Brita Romp.   Did have an appt on 11/30/2020 with Rosanna Randy but was cancelled by pt.   Last ordered: Amlodipine 11/07/2020 #30, 0 refills;   Atorvastatin 11/22/2020 #90, 0 refills  Returned because courtesy refills given due to appt being on 10/27 however since it was cancelled and new appt isn't until 1/10 she is going to run out before the new appt.   Provider to review for refills prior to new appt in Jan.

## 2020-11-30 ENCOUNTER — Ambulatory Visit: Payer: PPO | Admitting: Family Medicine

## 2020-12-11 ENCOUNTER — Telehealth: Payer: Self-pay

## 2020-12-11 NOTE — Telephone Encounter (Signed)
Copied from [place] [id number]. Topic: General - Inquiry >> Dec 11, 2020 10:13 AM Loma Boston wrote: lisinopril-hydrochlorothiazide (ZESTORETIC) 20-12.5 MG tablet 60 tablet 0 11/07/2020   Sig: TAKE 2 TABLETS BY MOUTH DAILY *CALL OFFICE TO SCHEDULE 6 MONTH FOLLOW-UP*  Sent to pharmacy as: lisinopril-hydrochlorothiazide (ZESTORETIC) 20-12.5 MG tablet  Notes to Pharmacy: PT IS REQUESTING REFILLS. PT SAYS SHE HAS AN APPT AT THE END OF THE MONTH This pt states out of above med has an appt, states has been trying get appt with Dr. B, admits was given with Dr Darnell Level and cancelled as not comfortable with female dr and would like refill before appt in Jan, states if just 30 supply will be out again before 1st available with Dr B Wants enough med till appt available. Last seen March 2022 Pls advise [phone] [facility], [place] - [address] [place] [address] Phone: [phone] Fax: [phone]

## 2020-12-12 ENCOUNTER — Other Ambulatory Visit: Payer: Self-pay

## 2020-12-12 MED ORDER — LISINOPRIL-HYDROCHLOROTHIAZIDE 20-12.5 MG PO TABS: ORAL_TABLET | ORAL | 0 refills | Status: DC

## 2021-01-01 ENCOUNTER — Ambulatory Visit: Payer: Self-pay

## 2021-01-01 NOTE — Telephone Encounter (Signed)
If she is <5d from start of symptoms, she can call [facility] pharmacy to get screened for antiviral use (eg Paxlovid)

## 2021-01-01 NOTE — Telephone Encounter (Signed)
Patient called in to say that she had been coughing, achy, headache and now with diarrhea also her son and family had Covid and she tested positive for Covid today and feels really horrible. Need to know what to do Please call  Ph# [phone]   Pt. Tested positive COVID 19 last night. Has cough, headache, body aches. No availability in the practice. Will try virtual visit through [email or web address].     Reason for Disposition  MILD difficulty breathing (e.g., minimal/no SOB at rest, SOB with walking, pulse <100)  Answer Assessment - Initial Assessment Questions 1. COVID-19 DIAGNOSIS: "Who made your COVID-19 diagnosis?" "Was it confirmed by a positive lab test or self-test?" If not diagnosed by a doctor (or NP/PA), ask "Are there lots of cases (community spread) where you live?" Note: See public health department website, if unsure.     Home test 2. COVID-19 EXPOSURE: "Was there any known exposure to COVID before the symptoms began?" CDC Definition of close contact: within 6 feet (2 meters) for a total of 15 minutes or more over a 24-hour period.      Yes 3. ONSET: "When did the COVID-19 symptoms start?"      Saturday 4. WORST SYMPTOM: "What is your worst symptom?" (e.g., cough, fever, shortness of breath, muscle aches)     Cough, headache,body aches 5. COUGH: "Do you have a cough?" If Yes, ask: "How bad is the cough?"       Moderate 6. FEVER: "Do you have a fever?" If Yes, ask: "What is your temperature, how was it measured, and when did it start?"     No 7. RESPIRATORY STATUS: "Describe your breathing?" (e.g., shortness of breath, wheezing, unable to speak)      No 8. BETTER-SAME-WORSE: "Are you getting better, staying the same or getting worse compared to yesterday?"  If getting worse, ask, "In what [place]?"     Same 9. HIGH RISK DISEASE: "Do you have any chronic medical problems?" (e.g., asthma, heart or lung disease, weak immune system, obesity, etc.)     Diabetes, HTN 10. VACCINE: "Have  you had the COVID-19 vaccine?" If Yes, ask: "Which one, how many shots, when did you get it?"       N/A 11. BOOSTER: "Have you received your COVID-19 booster?" If Yes, ask: "Which one and when did you get it?"       N/A 12. PREGNANCY: "Is there any chance you are pregnant?" "When was your last menstrual period?"       No 13. OTHER SYMPTOMS: "Do you have any other symptoms?"  (e.g., chills, fatigue, headache, loss of smell or taste, muscle pain, sore throat)       Headache 14. O2 SATURATION MONITOR:  "Do you use an oxygen saturation monitor (pulse oximeter) at home?" If Yes, ask "What is your reading (oxygen level) today?" "What is your usual oxygen saturation reading?" (e.g., 95%)       No  Protocols used: Coronavirus (COVID-19) Diagnosed or Suspected-A-AH

## 2021-01-04 ENCOUNTER — Other Ambulatory Visit: Payer: Self-pay | Admitting: Family Medicine

## 2021-01-04 NOTE — Telephone Encounter (Signed)
Copied from [place] [id number]. Topic: Quick Communication - Rx Refill/Question >> Jan 04, 2021  8:48 AM Tessa Lerner A wrote: Medication: amLODipine (NORVASC) 10 MG tablet [[id number]]   Has the patient contacted their pharmacy? Yes. The patient was directed to contact their PCP.  (Agent: If no, request that the patient contact the pharmacy for the refill. If patient does not wish to contact the pharmacy document the reason why and proceed with request.) (Agent: If yes, when and what did the pharmacy advise?)  Preferred Pharmacy (with phone number or street name): [place], [place] - [place]  Phone:  [phone] Fax:  [phone]  Has the patient been seen for an appointment in the last year OR does the patient have an upcoming appointment? Yes.    Agent: Please be advised that RX refills may take up to 3 business days. We ask that you follow-up with your pharmacy.

## 2021-01-05 MED ORDER — AMLODIPINE BESYLATE 10 MG PO TABS: ORAL_TABLET | ORAL | 0 refills | Status: DC

## 2021-01-05 NOTE — Telephone Encounter (Signed)
Requested medication (s) are on the active medication list: yes  Future visit scheduled: 02/13/21  Notes to clinic:  last visit 04/07/20, Failed protocol due to no valid visit within 6  months, please assess.       Requested Prescriptions  Pending Prescriptions Disp Refills   amLODipine (NORVASC) 10 MG tablet 30 tablet 0    Sig: TAKE 1 TABLET BY MOUTH DAILY *CALL FOR OFFICE USE TO SCHEDULE 6 MONTH FOLLOW UP*     Cardiovascular:  Calcium Channel Blockers Failed - 01/04/2021 12:19 PM      Failed - Valid encounter within last 6 months    Recent Outpatient Visits           9 months ago Type 2 diabetes mellitus with diabetic polyneuropathy, without long-term current use of insulin [facility])   [facility], Dionne Bucy, MD   1 year ago Encounter for annual physical exam   [facility] [place], Dionne Bucy, MD   2 years ago Type 2 diabetes mellitus with diabetic polyneuropathy, without long-term current use of insulin [facility])   [facility], Dionne Bucy, MD   2 years ago Encounter for annual physical exam   [facility] [place], Dionne Bucy, MD   3 years ago Type 2 diabetes mellitus with hyperglycemia, without long-term current use of insulin [facility])   [facility], Dionne Bucy, MD       Future Appointments             In 1 month Bacigalupo, Dionne Bucy, MD [facility]            Passed - Last BP in normal range    BP Readings from Last 1 Encounters:  04/07/20 132/78

## 2021-01-23 ENCOUNTER — Other Ambulatory Visit: Payer: Self-pay | Admitting: Family Medicine

## 2021-01-23 NOTE — Telephone Encounter (Signed)
Copied from [place] [id number]. Topic: Quick Communication - Rx Refill/Question >> Jan 23, 2021  9:04 AM Tessa Lerner A wrote: Medication: metFORMIN (GLUCOPHAGE) 1000 MG tablet [[id number]]  Has the patient contacted their pharmacy? Yes.  The patient has been directed to contact their PCP (Agent: If no, request that the patient contact the pharmacy for the refill. If patient does not wish to contact the pharmacy document the reason why and proceed with request.) (Agent: If yes, when and what did the pharmacy advise?)  Preferred Pharmacy (with phone number or street name): [place], [place] - [address] [place] [address] Phone: [phone] Fax: [phone]   Has the patient been seen for an appointment in the last year OR does the patient have an upcoming appointment? Yes.    Agent: Please be advised that RX refills may take up to 3 business days. We ask that you follow-up with your pharmacy.

## 2021-01-24 NOTE — Telephone Encounter (Signed)
Medication was refilled on 01/23/21.

## 2021-02-13 ENCOUNTER — Other Ambulatory Visit: Payer: Self-pay

## 2021-02-13 ENCOUNTER — Encounter: Payer: Self-pay | Admitting: Family Medicine

## 2021-02-13 ENCOUNTER — Ambulatory Visit (INDEPENDENT_AMBULATORY_CARE_PROVIDER_SITE_OTHER): Payer: PPO | Admitting: Family Medicine

## 2021-02-13 VITALS — BP 138/67 | HR 68 | Temp 98.2°F | Resp 16 | Ht 62.0 in | Wt 167.6 lb

## 2021-02-13 DIAGNOSIS — E785 Hyperlipidemia, unspecified: Secondary | ICD-10-CM

## 2021-02-13 DIAGNOSIS — Z1231 Encounter for screening mammogram for malignant neoplasm of breast: Secondary | ICD-10-CM | POA: Diagnosis not present

## 2021-02-13 DIAGNOSIS — I152 Hypertension secondary to endocrine disorders: Secondary | ICD-10-CM | POA: Diagnosis not present

## 2021-02-13 DIAGNOSIS — E1169 Type 2 diabetes mellitus with other specified complication: Secondary | ICD-10-CM | POA: Diagnosis not present

## 2021-02-13 DIAGNOSIS — E1159 Type 2 diabetes mellitus with other circulatory complications: Secondary | ICD-10-CM

## 2021-02-13 DIAGNOSIS — E1142 Type 2 diabetes mellitus with diabetic polyneuropathy: Secondary | ICD-10-CM | POA: Diagnosis not present

## 2021-02-13 DIAGNOSIS — Z23 Encounter for immunization: Secondary | ICD-10-CM

## 2021-02-13 LAB — POCT GLYCOSYLATED HEMOGLOBIN (HGB A1C)
Est. average glucose Bld gHb Est-mCnc: 128
Hemoglobin A1C: 6.1 % — AB (ref 4.0–5.6)

## 2021-02-13 NOTE — Assessment & Plan Note (Signed)
Well controlled Associated with HTN and HLD A1c 6.1 today Continue current medication On ACEi and statin Foot exam done today Has eye exam scheduled in 2 weeks F/u in 6 months

## 2021-02-13 NOTE — Assessment & Plan Note (Signed)
Chronic, mild Sensation intact except for mild loss of sensation on 4th toe bilaterally Not requiring medications Continue to monitor

## 2021-02-13 NOTE — Assessment & Plan Note (Signed)
Previously well controlled Continue atorvastatin 20mg  daily Check lipid panel

## 2021-02-13 NOTE — Progress Notes (Signed)
Established patient visit   [name]   DOB: Oct 22, 1950   71 y.o. Female  MRN: [id number] Visit Date: 02/13/2021  Today's healthcare provider: Lavon Paganini, MD   Chief Complaint  Patient presents with   Diabetes   Hyperlipidemia   Hypertension   Subjective    Diabetes  Hyperlipidemia  Hypertension   DM Eye exam scheduled in 2 weeks Neuropathy has made walking more difficult, but she still walks 2 miles multiple times on the weekend  Medications: Outpatient Medications Prior to Visit  Medication Sig   acetaminophen (TYLENOL) 500 MG tablet Take 500 mg by mouth every 6 (six) hours as needed.   amLODipine (NORVASC) 10 MG tablet TAKE 1 TABLET BY MOUTH DAILY   atorvastatin (LIPITOR) 20 MG tablet Take 1 tablet (20 mg total) by mouth daily.   Coenzyme Q10 (COQ10) 200 MG CAPS Take 1 capsule by mouth daily. (Patient taking differently: Take 100 mg by mouth daily.)   glucose blood test strip RELION CONFIRM/MICRO TEST (In Vitro Strip)  1 (one) Strip Strip daily for 0 days  Quantity: 100;  Refills: 3   Ordered :29-July-2012  [name] ;  Started 29-July-2012 Active   lisinopril-hydrochlorothiazide (ZESTORETIC) 20-12.5 MG tablet TAKE 2 TABLETS BY MOUTH DAILY. Please keep office visit, before any future refills.   metFORMIN (GLUCOPHAGE) 1000 MG tablet TAKE 1 TABLET BY MOUTH TWICE DAILY   Multiple Vitamins-Minerals (MULTIVITAMIN ADULTS PO) Take by mouth daily.    Omega-3 Fatty Acids (FISH OIL) 1000 MG CAPS Take by mouth. Reported on 02/22/2015   No facility-administered medications prior to visit.    Review of Systems  Constitutional: Negative.   HENT: Negative.    Eyes: Negative.   Respiratory: Negative.    Cardiovascular: Negative.   Gastrointestinal: Negative.   Musculoskeletal:  Positive for arthralgias.  Skin: Negative.       Objective    BP 138/67 (BP Location: Right Arm, Patient Position: Sitting, Cuff Size: Large)    Pulse 68    Temp 98.2  F (36.8 C) (Oral)    Resp 16    Ht 5\' 2"  (1.575 m)    Wt 167 lb 9.6 oz (76 kg)    SpO2 100%    BMI 30.65 kg/m  {Show previous vital signs (optional):23777}  Physical Exam Vitals reviewed.  Constitutional:      General: She is not in acute distress.    Appearance: Normal appearance. She is not ill-appearing or toxic-appearing.  HENT:     Head: Normocephalic and atraumatic.     Right Ear: External ear normal.     Left Ear: External ear normal.     Nose: Nose normal.     Mouth/Throat:     Mouth: Mucous membranes are moist.     Pharynx: Oropharynx is clear. No oropharyngeal exudate or posterior oropharyngeal erythema.  Eyes:     General: No scleral icterus.    Extraocular Movements: Extraocular movements intact.     Conjunctiva/sclera: Conjunctivae normal.     Pupils: Pupils are equal, round, and reactive to light.  Cardiovascular:     Rate and Rhythm: Normal rate and regular rhythm.     Pulses: Normal pulses.     Heart sounds: Normal heart sounds. No murmur heard.   No friction rub. No gallop.  Pulmonary:     Effort: Pulmonary effort is normal. No respiratory distress.     Breath sounds: Normal breath sounds. No wheezing or rhonchi.  Chest:  Chest wall: No tenderness.  Abdominal:     General: Abdomen is flat. There is no distension.     Palpations: Abdomen is soft.     Tenderness: There is no abdominal tenderness.  Musculoskeletal:        General: Normal range of motion.     Cervical back: Normal range of motion and neck supple.     Right lower leg: No edema.     Left lower leg: No edema.  Skin:    General: Skin is warm and dry.     Capillary Refill: Capillary refill takes less than 2 seconds.     Findings: No lesion or rash.  Neurological:     General: No focal deficit present.     Mental Status: She is alert and oriented to person, place, and time. Mental status is at baseline.  Psychiatric:        Mood and Affect: Mood normal.        Behavior: Behavior normal.         Thought Content: Thought content normal.        Judgment: Judgment normal.     Results for orders placed or performed in visit on 02/13/21  POCT glycosylated hemoglobin (Hb A1C)  Result Value Ref Range   Hemoglobin A1C 6.1 (A) 4.0 - 5.6 %   Est. average glucose Bld gHb Est-mCnc 128     Assessment & Plan     Problem List Items Addressed This Visit       Cardiovascular and Mediastinum   Hypertension associated with diabetes ([place])    Well controlled Continue current medications Recheck metabolic panel      Relevant Orders   Comprehensive metabolic panel   Lipid panel     Endocrine   Diabetes mellitus, type 2 ([place]) - Primary    Well controlled Associated with HTN and HLD A1c 6.1 today Continue current medication On ACEi and statin Foot exam done today Has eye exam scheduled in 2 weeks F/u in 6 months      Relevant Orders   POCT glycosylated hemoglobin (Hb A1C) (Completed)   Hyperlipidemia associated with type 2 diabetes mellitus (HCC)    Previously well controlled Continue atorvastatin 20mg  daily Check lipid panel      Relevant Orders   Comprehensive metabolic panel   Lipid panel   Diabetic neuropathy (HCC)    Chronic, mild Sensation intact except for mild loss of sensation on 4th toe bilaterally Not requiring medications Continue to monitor      Other Visit Diagnoses     Screening mammogram for breast cancer       Relevant Orders   MM 3D SCREEN BREAST BILATERAL   Need for influenza vaccination       Relevant Orders   Flu Vaccine QUAD High Dose(Fluad) (Completed)        Agrees to stool cards only for colon cancer screening. She will do these at home and bring them back to the office.  Return in about 6 months (around 08/13/2021) for CPE, AWV.      Nelva Nay, Medical Student 02/13/2021, 9:01 AM   Patient seen along with MS3 student Nelva Nay. I personally evaluated this patient along with the student, and verified all aspects of the  history, physical exam, and medical decision making as documented by the student. I agree with the student's documentation and have made all necessary edits.  Zelie Asbill, Dionne Bucy, MD, MPH [facility]

## 2021-02-13 NOTE — Assessment & Plan Note (Signed)
Well controlled Continue current medications Recheck metabolic panel 

## 2021-02-13 NOTE — Patient Instructions (Signed)
Ask at the pharmacy about shingrix and covid booster  The CDC recommends two doses of Shingrix (the shingles vaccine) separated by 2 to 6 months for adults age 72 years and older. I recommend checking with your insurance plan regarding coverage for this vaccine.

## 2021-02-14 LAB — COMPREHENSIVE METABOLIC PANEL
ALT: 12 IU/L (ref 0–32)
AST: 15 IU/L (ref 0–40)
Albumin/Globulin Ratio: 2.2 (ref 1.2–2.2)
Albumin: 4.7 g/dL (ref 3.7–4.7)
Alkaline Phosphatase: 50 IU/L (ref 44–121)
BUN/Creatinine Ratio: 29 — ABNORMAL HIGH (ref 12–28)
BUN: 28 mg/dL — ABNORMAL HIGH (ref 8–27)
Bilirubin Total: 0.3 mg/dL (ref 0.0–1.2)
CO2: 24 mmol/L (ref 20–29)
Calcium: 10.1 mg/dL (ref 8.7–10.3)
Chloride: 98 mmol/L (ref 96–106)
Creatinine, Ser: 0.98 mg/dL (ref 0.57–1.00)
Globulin, Total: 2.1 g/dL (ref 1.5–4.5)
Glucose: 129 mg/dL — ABNORMAL HIGH (ref 70–99)
Potassium: 4.2 mmol/L (ref 3.5–5.2)
Sodium: 138 mmol/L (ref 134–144)
Total Protein: 6.8 g/dL (ref 6.0–8.5)
eGFR: 61 mL/min/{1.73_m2} (ref >59)

## 2021-02-14 LAB — LIPID PANEL
Chol/HDL Ratio: 2.5 ratio (ref 0.0–4.4)
Cholesterol, Total: 138 mg/dL (ref 100–199)
HDL: 56 mg/dL (ref >39)
LDL Chol Calc (NIH): 68 mg/dL (ref 0–99)
Triglycerides: 70 mg/dL (ref 0–149)
VLDL Cholesterol Cal: 14 mg/dL (ref 5–40)

## 2021-02-27 DIAGNOSIS — E119 Type 2 diabetes mellitus without complications: Secondary | ICD-10-CM | POA: Diagnosis not present

## 2021-02-27 LAB — HM DIABETES EYE EXAM

## 2021-03-12 ENCOUNTER — Other Ambulatory Visit: Payer: Self-pay | Admitting: Family Medicine

## 2021-03-29 ENCOUNTER — Encounter: Payer: Self-pay | Admitting: Family Medicine

## 2021-04-02 ENCOUNTER — Other Ambulatory Visit: Payer: Self-pay | Admitting: Family Medicine

## 2021-04-03 ENCOUNTER — Other Ambulatory Visit: Payer: Self-pay | Admitting: Family Medicine

## 2021-04-03 DIAGNOSIS — E1142 Type 2 diabetes mellitus with diabetic polyneuropathy: Secondary | ICD-10-CM

## 2021-04-03 NOTE — Telephone Encounter (Signed)
Requested Prescriptions  Pending Prescriptions Disp Refills   amLODipine (NORVASC) 10 MG tablet [Pharmacy Med Name: AMLODIPINE BESYLATE 10 MG TAB] 90 tablet 0    Sig: TAKE ONE TABLET BY MOUTH DAILY     Cardiovascular: Calcium Channel Blockers 2 Passed - 04/02/2021 12:35 PM      Passed - Last BP in normal range    BP Readings from Last 1 Encounters:  02/13/21 138/67         Passed - Last Heart Rate in normal range    Pulse Readings from Last 1 Encounters:  02/13/21 68         Passed - Valid encounter within last 6 months    Recent Outpatient Visits          1 month ago Type 2 diabetes mellitus with diabetic polyneuropathy, without long-term current use of insulin ([place])   [facility] [place], Dionne Bucy, MD   12 months ago Type 2 diabetes mellitus with diabetic polyneuropathy, without long-term current use of insulin [facility])   [facility] [place], Dionne Bucy, MD   1 year ago Encounter for annual physical exam   [facility] [place], Dionne Bucy, MD   2 years ago Type 2 diabetes mellitus with diabetic polyneuropathy, without long-term current use of insulin [facility])   [facility] [place], Dionne Bucy, MD   2 years ago Encounter for annual physical exam   [facility] Bacigalupo, Dionne Bucy, MD      Future Appointments            In 3 months Bacigalupo, Dionne Bucy, MD [facility] [place]

## 2021-04-03 NOTE — Telephone Encounter (Signed)
Requested medications are due for refill today.  yes  Requested medications are on the active medications list.  yes  Last refill. 01/23/2021  #180 0 refills  Future visit scheduled.   yes  Notes to clinic.  Failed protocol d/t expired labs.   Requested Prescriptions  Pending Prescriptions Disp Refills   metFORMIN (GLUCOPHAGE) 1000 MG tablet [Pharmacy Med Name: METFORMIN HCL 1000 MG TAB] 180 tablet 0    Sig: TAKE 1 TABLET BY MOUTH TWICE DAILY     Endocrinology:  Diabetes - Biguanides Failed - 04/03/2021 10:13 AM      Failed - CBC within normal limits and completed in the last 12 months    WBC  Date Value Ref Range Status  12/18/2016 CANCELED      Comment:        Test not performed.         Duplicate test.               Result canceled by the ancillary.    RBC  Date Value Ref Range Status  12/18/2016 5.37 (H) 3.80 - 5.10 Million/uL Final   Hemoglobin  Date Value Ref Range Status  12/18/2016 13.0 11.7 - 15.5 g/dL Final  11/29/2015 12.0 11.1 - 15.9 g/dL Final   HCT  Date Value Ref Range Status  12/18/2016 39.9 35.0 - 45.0 % Final   Hematocrit  Date Value Ref Range Status  11/29/2015 36.4 34.0 - 46.6 % Final   MCHC  Date Value Ref Range Status  12/18/2016 32.6 32.0 - 36.0 g/dL Final   [facility]  Date Value Ref Range Status  12/18/2016 24.2 (L) 27.0 - 33.0 pg Final   MCV  Date Value Ref Range Status  12/18/2016 74.3 (L) 80.0 - 100.0 fL Final  11/29/2015 76 (L) 79 - 97 fL Final  01/11/2013 76 (L) 80 - 100 fL Final   No results found for: PLTCOUNTKUC, LABPLAT, POCPLA RDW  Date Value Ref Range Status  12/18/2016 14.8 11.0 - 15.0 % Final  11/29/2015 15.1 12.3 - 15.4 % Final  01/11/2013 15.5 (H) 11.5 - 14.5 % Final         Passed - Cr in normal range and within 360 days    Creatinine  Date Value Ref Range Status  01/11/2013 0.92 0.60 - 1.30 mg/dL Final   Creatinine, Ser  Date Value Ref Range Status  02/13/2021 0.98 0.57 - 1.00 mg/dL Final           Passed - HBA1C is between 0 and 7.9 and within 180 days    Hemoglobin A1C  Date Value Ref Range Status  02/13/2021 6.1 (A) 4.0 - 5.6 % Final   Hgb A1c MFr Bld  Date Value Ref Range Status  04/10/2020 6.5 (H) 4.8 - 5.6 % Final    Comment:             Prediabetes: 5.7 - 6.4          Diabetes: >6.4          Glycemic control for adults with diabetes: <7.0           Passed - eGFR in normal range and within 360 days    EGFR (African American)  Date Value Ref Range Status  01/11/2013 >60  Final   GFR calc Af Amer  Date Value Ref Range Status  04/21/2019 73 >59 mL/min/1.73 Final   EGFR (Non-African Amer.)  Date Value Ref Range Status  01/11/2013 >60  Final  Comment:    eGFR values <17m/min/1.73 m2 may be an indication of chronic kidney disease (CKD). Calculated eGFR is useful in patients with stable renal function. The eGFR calculation will not be reliable in acutely ill patients when serum creatinine is changing rapidly. It is not useful in  patients on dialysis. The eGFR calculation may not be applicable to patients at the low and high extremes of body sizes, pregnant women, and vegetarians.    GFR calc non Af Amer  Date Value Ref Range Status  04/21/2019 63 >59 mL/min/1.73 Final   eGFR  Date Value Ref Range Status  02/13/2021 61 >59 mL/min/1.73 Final          Passed - B12 Level in normal range and within 720 days    Vitamin B-12  Date Value Ref Range Status  04/21/2019 480 232 - 1,245 pg/mL Final          Passed - Valid encounter within last 6 months    Recent Outpatient Visits           1 month ago Type 2 diabetes mellitus with diabetic polyneuropathy, without long-term current use of insulin ([place]   [facility]   12 months ago Type 2 diabetes mellitus with diabetic polyneuropathy, without long-term current use of insulin ([facility]   [facility]   1 year ago Encounter for annual  physical exam   [facility]   2 years ago Type 2 diabetes mellitus with diabetic polyneuropathy, without long-term current use of insulin ([facility]   [facility]   2 years ago Encounter for annual physical exam   [facility]       Future Appointments             In 3 months Bacigalupo, [facility] [place]

## 2021-05-21 ENCOUNTER — Other Ambulatory Visit: Payer: Self-pay | Admitting: Family Medicine

## 2021-06-04 ENCOUNTER — Other Ambulatory Visit: Payer: Self-pay | Admitting: Family Medicine

## 2021-06-25 ENCOUNTER — Other Ambulatory Visit: Payer: Self-pay | Admitting: Family Medicine

## 2021-07-26 ENCOUNTER — Other Ambulatory Visit: Payer: Self-pay | Admitting: Family Medicine

## 2021-07-26 DIAGNOSIS — E1142 Type 2 diabetes mellitus with diabetic polyneuropathy: Secondary | ICD-10-CM

## 2021-07-30 ENCOUNTER — Encounter: Payer: Self-pay | Admitting: Family Medicine

## 2021-07-30 ENCOUNTER — Ambulatory Visit (INDEPENDENT_AMBULATORY_CARE_PROVIDER_SITE_OTHER): Payer: PPO | Admitting: Family Medicine

## 2021-07-30 ENCOUNTER — Encounter: Payer: PPO | Admitting: Family Medicine

## 2021-07-30 VITALS — BP 142/83 | HR 62 | Temp 98.5°F | Resp 12 | Ht 62.5 in | Wt 173.0 lb

## 2021-07-30 DIAGNOSIS — E1159 Type 2 diabetes mellitus with other circulatory complications: Secondary | ICD-10-CM

## 2021-07-30 DIAGNOSIS — E669 Obesity, unspecified: Secondary | ICD-10-CM | POA: Diagnosis not present

## 2021-07-30 DIAGNOSIS — Z6831 Body mass index (BMI) 31.0-31.9, adult: Secondary | ICD-10-CM

## 2021-07-30 DIAGNOSIS — E1142 Type 2 diabetes mellitus with diabetic polyneuropathy: Secondary | ICD-10-CM

## 2021-07-30 DIAGNOSIS — I152 Hypertension secondary to endocrine disorders: Secondary | ICD-10-CM

## 2021-07-31 ENCOUNTER — Telehealth: Payer: Self-pay

## 2021-07-31 LAB — COMPREHENSIVE METABOLIC PANEL
ALT: 15 IU/L (ref 0–32)
AST: 17 IU/L (ref 0–40)
Albumin/Globulin Ratio: 2 (ref 1.2–2.2)
Albumin: 4.5 g/dL (ref 3.7–4.7)
Alkaline Phosphatase: 51 IU/L (ref 44–121)
BUN/Creatinine Ratio: 19 (ref 12–28)
BUN: 20 mg/dL (ref 8–27)
Bilirubin Total: 0.4 mg/dL (ref 0.0–1.2)
CO2: 24 mmol/L (ref 20–29)
Calcium: 9.8 mg/dL (ref 8.7–10.3)
Chloride: 100 mmol/L (ref 96–106)
Creatinine, Ser: 1.04 mg/dL — ABNORMAL HIGH (ref 0.57–1.00)
Globulin, Total: 2.2 g/dL (ref 1.5–4.5)
Glucose: 128 mg/dL — ABNORMAL HIGH (ref 70–99)
Potassium: 3.8 mmol/L (ref 3.5–5.2)
Sodium: 141 mmol/L (ref 134–144)
Total Protein: 6.7 g/dL (ref 6.0–8.5)
eGFR: 57 mL/min/{1.73_m2} — ABNORMAL LOW (ref >59)

## 2021-07-31 LAB — HEMOGLOBIN A1C
Est. average glucose Bld gHb Est-mCnc: 143 mg/dL
Hgb A1c MFr Bld: 6.6 % — ABNORMAL HIGH (ref 4.8–5.6)

## 2021-09-21 ENCOUNTER — Other Ambulatory Visit: Payer: Self-pay | Admitting: Family Medicine

## 2021-12-20 ENCOUNTER — Other Ambulatory Visit: Payer: Self-pay | Admitting: Family Medicine

## 2021-12-20 NOTE — Telephone Encounter (Signed)
Requested Prescriptions  Pending Prescriptions Disp Refills   lisinopril-hydrochlorothiazide (ZESTORETIC) 20-12.5 MG tablet [Pharmacy Med Name: LISINOPRIL-HCTZ 20-12.5 MG TAB] 180 tablet 0    Sig: TAKE 2 TABLETS BY MOUTH DAILY     Cardiovascular:  ACEI + Diuretic Combos Failed - 12/20/2021  1:45 PM      Failed - Cr in normal range and within 180 days    Creatinine  Date Value Ref Range Status  01/11/2013 0.92 0.60 - 1.30 mg/dL Final   Creatinine, Ser  Date Value Ref Range Status  07/30/2021 1.04 (H) 0.57 - 1.00 mg/dL Final         Failed - Last BP in normal range    BP Readings from Last 1 Encounters:  07/30/21 (!) 142/83         Passed - Na in normal range and within 180 days    Sodium  Date Value Ref Range Status  07/30/2021 141 134 - 144 mmol/L Final  01/11/2013 135 (L) 136 - 145 mmol/L Final         Passed - K in normal range and within 180 days    Potassium  Date Value Ref Range Status  07/30/2021 3.8 3.5 - 5.2 mmol/L Final  01/11/2013 3.4 (L) 3.5 - 5.1 mmol/L Final         Passed - eGFR is 30 or above and within 180 days    EGFR (African American)  Date Value Ref Range Status  01/11/2013 >60  Final   GFR calc Af Amer  Date Value Ref Range Status  04/21/2019 73 >59 mL/min/1.73 Final   EGFR (Non-African Amer.)  Date Value Ref Range Status  01/11/2013 >60  Final    Comment:    eGFR values <43m/min/1.73 m2 may be an indication of chronic kidney disease (CKD). Calculated eGFR is useful in patients with stable renal function. The eGFR calculation will not be reliable in acutely ill patients when serum creatinine is changing rapidly. It is not useful in  patients on dialysis. The eGFR calculation may not be applicable to patients at the low and high extremes of body sizes, pregnant women, and vegetarians.    GFR calc non Af Amer  Date Value Ref Range Status  04/21/2019 63 >59 mL/min/1.73 Final   eGFR  Date Value Ref Range Status  07/30/2021 57 (L) >59  mL/min/1.73 Final         Passed - Patient is not pregnant      Passed - Valid encounter within last 6 months    Recent Outpatient Visits           4 months ago Type 2 diabetes mellitus with diabetic polyneuropathy, without long-term current use of insulin ([facility]   [facility]   10 months ago Type 2 diabetes mellitus with diabetic polyneuropathy, without long-term current use of insulin ([facility]   [facility]   1 year ago Type 2 diabetes mellitus with diabetic polyneuropathy, without long-term current use of insulin ([facility]   [facility]   2 years ago Encounter for annual physical exam   [facility]   3 years ago Type 2 diabetes mellitus with diabetic polyneuropathy, without long-term current use of insulin ([facility]   [facility]       Future Appointments             In 1  month Bacigalupo, Dionne Bucy, MD [facility]             amLODipine (NORVASC) 10 MG tablet [Pharmacy Med Name: AMLODIPINE BESYLATE 10 MG TAB] 90 tablet 0    Sig: TAKE ONE TABLET BY MOUTH DAILY     Cardiovascular: Calcium Channel Blockers 2 Failed - 12/20/2021  1:45 PM      Failed - Last BP in normal range    BP Readings from Last 1 Encounters:  07/30/21 (!) 142/83         Passed - Last Heart Rate in normal range    Pulse Readings from Last 1 Encounters:  07/30/21 62         Passed - Valid encounter within last 6 months    Recent Outpatient Visits           4 months ago Type 2 diabetes mellitus with diabetic polyneuropathy, without long-term current use of insulin [facility])   [facility] Jerrol Banana., MD   10 months ago Type 2 diabetes mellitus with diabetic polyneuropathy, without long-term current use of insulin ([place])   [facility] [place], Dionne Bucy, MD   1 year ago Type 2 diabetes mellitus with diabetic polyneuropathy, without long-term current use of insulin [facility])   [facility], Dionne Bucy, MD   2 years ago Encounter for annual physical exam   [facility] [place], Dionne Bucy, MD   3 years ago Type 2 diabetes mellitus with diabetic polyneuropathy, without long-term current use of insulin [facility])   [place], Dionne Bucy, MD       Future Appointments             In 1 month Bacigalupo, Dionne Bucy, MD [facility], [facility]

## 2022-01-21 ENCOUNTER — Other Ambulatory Visit: Payer: Self-pay | Admitting: Family Medicine

## 2022-01-21 DIAGNOSIS — E1142 Type 2 diabetes mellitus with diabetic polyneuropathy: Secondary | ICD-10-CM

## 2022-02-11 ENCOUNTER — Ambulatory Visit (INDEPENDENT_AMBULATORY_CARE_PROVIDER_SITE_OTHER): Payer: PPO | Admitting: Family Medicine

## 2022-02-11 ENCOUNTER — Encounter: Payer: Self-pay | Admitting: Family Medicine

## 2022-02-11 VITALS — BP 130/70 | HR 71 | Temp 98.1°F | Resp 16 | Wt 171.2 lb

## 2022-02-11 DIAGNOSIS — Z6830 Body mass index (BMI) 30.0-30.9, adult: Secondary | ICD-10-CM

## 2022-02-11 DIAGNOSIS — E785 Hyperlipidemia, unspecified: Secondary | ICD-10-CM

## 2022-02-11 DIAGNOSIS — E1142 Type 2 diabetes mellitus with diabetic polyneuropathy: Secondary | ICD-10-CM

## 2022-02-11 DIAGNOSIS — E1169 Type 2 diabetes mellitus with other specified complication: Secondary | ICD-10-CM | POA: Diagnosis not present

## 2022-02-11 DIAGNOSIS — Z23 Encounter for immunization: Secondary | ICD-10-CM | POA: Diagnosis not present

## 2022-02-11 DIAGNOSIS — I152 Hypertension secondary to endocrine disorders: Secondary | ICD-10-CM | POA: Diagnosis not present

## 2022-02-11 DIAGNOSIS — E669 Obesity, unspecified: Secondary | ICD-10-CM

## 2022-02-11 DIAGNOSIS — E1159 Type 2 diabetes mellitus with other circulatory complications: Secondary | ICD-10-CM | POA: Diagnosis not present

## 2022-02-11 NOTE — Assessment & Plan Note (Signed)
Well controlled on home readings Continue current medications Recheck metabolic panel F/u in 6 months  

## 2022-02-11 NOTE — Assessment & Plan Note (Signed)
Discussed importance of healthy weight management Discussed diet and exercise  

## 2022-02-11 NOTE — Progress Notes (Signed)
I,Sulibeya S Dimas,acting as a Education administrator for Lavon Paganini, MD.,have documented all relevant documentation on the behalf of Lavon Paganini, MD,as directed by  Lavon Paganini, MD while in the presence of Lavon Paganini, MD.     Established patient visit   [name]   DOB: January 22, 1951   72 y.o. Female  MRN: [id number] Visit Date: 02/11/2022  Today's healthcare provider: Lavon Paganini, MD   Chief Complaint  Patient presents with   Diabetes   Hypertension   Subjective    HPI  Diabetes Mellitus Type II, follow-up  Lab Results  Component Value Date   HGBA1C 6.6 (H) 07/30/2021   HGBA1C 6.1 (A) 02/13/2021   HGBA1C 6.5 (H) 04/10/2020   Last seen for diabetes 6 months ago.  Management since then includes continuing the same treatment. She reports excellent compliance with treatment. She is not having side effects.  Home blood sugar records: fasting range: 120-150   Current insulin regiment: none Most Recent Eye Exam: UTD  --------------------------------------------------------------------------------------------------- Hypertension, follow-up  BP Readings from Last 3 Encounters:  02/11/22 130/70  07/30/21 (!) 142/83  07/30/21 (!) 150/81   Wt Readings from Last 3 Encounters:  02/11/22 171 lb 3.2 oz (77.7 kg)  07/30/21 173 lb (78.5 kg)  07/30/21 173 lb (78.5 kg)     She was last seen for hypertension 6 months ago.  BP at that visit was 142/83. Management since that visit includes no changes. She reports excellent compliance with treatment. She is not having side effects.   Outside blood pressures are 120/60-140/80.  ---------------------------------------------------------------------------------------------------  Declines breast and colon cancer screening  Medications: Outpatient Medications Prior to Visit  Medication Sig   acetaminophen (TYLENOL) 500 MG tablet Take 500 mg by mouth every 6 (six) hours as needed.   amLODipine  (NORVASC) 10 MG tablet TAKE ONE TABLET BY MOUTH DAILY   atorvastatin (LIPITOR) 20 MG tablet TAKE 1 TABLET BY MOUTH DAILY   Coenzyme Q10 (COQ10) 200 MG CAPS Take 1 capsule by mouth daily. (Patient taking differently: Take 100 mg by mouth daily.)   glucose blood test strip RELION CONFIRM/MICRO TEST (In Vitro Strip)  1 (one) Strip Strip daily for 0 days  Quantity: 100;  Refills: 3   Ordered :29-July-2012  [name] ;  Started 29-July-2012 Active   lisinopril-hydrochlorothiazide (ZESTORETIC) 20-12.5 MG tablet TAKE 2 TABLETS BY MOUTH DAILY   metFORMIN (GLUCOPHAGE) 1000 MG tablet TAKE 1 TABLET BY MOUTH TWICE DAILY   Multiple Vitamins-Minerals (MULTIVITAMIN ADULTS PO) Take by mouth daily.    Omega-3 Fatty Acids (FISH OIL) 1000 MG CAPS Take by mouth. Reported on 02/22/2015   No facility-administered medications prior to visit.    Review of Systems  Constitutional:  Negative for appetite change and fatigue.  Eyes:  Negative for visual disturbance.  Respiratory:  Negative for chest tightness and shortness of breath.   Cardiovascular:  Negative for chest pain and leg swelling.  Gastrointestinal:  Negative for abdominal pain, diarrhea, nausea and vomiting.  Neurological:  Negative for dizziness, light-headedness and headaches.       Objective    BP 130/70 Comment: home reading  Pulse 71   Temp 98.1 F (36.7 C) (Oral)   Resp 16   Wt 171 lb 3.2 oz (77.7 kg)   SpO2 100%   BMI 30.81 kg/m    Physical Exam Vitals reviewed.  Constitutional:      General: She is not in acute distress.    Appearance: Normal appearance. She is well-developed. She  is not diaphoretic.  HENT:     Head: Normocephalic and atraumatic.  Eyes:     General: No scleral icterus.    Conjunctiva/sclera: Conjunctivae normal.  Neck:     Thyroid: No thyromegaly.  Cardiovascular:     Rate and Rhythm: Normal rate and regular rhythm.     Heart sounds: Normal heart sounds. No murmur heard. Pulmonary:     Effort:  Pulmonary effort is normal. No respiratory distress.     Breath sounds: Normal breath sounds. No wheezing, rhonchi or rales.  Musculoskeletal:     Cervical back: Neck supple.     Right lower leg: No edema.     Left lower leg: No edema.  Lymphadenopathy:     Cervical: No cervical adenopathy.  Skin:    General: Skin is warm and dry.     Findings: No rash.  Neurological:     Mental Status: She is alert and oriented to person, place, and time. Mental status is at baseline.  Psychiatric:        Mood and Affect: Mood normal.        Behavior: Behavior normal.     Diabetic Foot Exam - Simple   Simple Foot Form Diabetic Foot exam was performed with the following findings: Yes 02/11/2022  8:34 AM  Visual Inspection No deformities, no ulcerations, no other skin breakdown bilaterally: Yes Sensation Testing Intact to touch and monofilament testing bilaterally: Yes Pulse Check Posterior Tibialis and Dorsalis pulse intact bilaterally: Yes Comments      No results found for any visits on 02/11/22.  Assessment & Plan     Problem List Items Addressed This Visit       Cardiovascular and Mediastinum   Hypertension associated with diabetes ([place])    Well controlled on home readings Continue current medications Recheck metabolic panel F/u in 6 months       Relevant Orders   Comprehensive metabolic panel     Endocrine   Diabetes mellitus, type 2 ([place]) - Primary    Well controlled previously Recheck A1c Continue current medications UTD on vaccines, eye exam (upcoming), foot exam (completed today) UACR today On ACEi/ARB On Statin Discussed diet and exercise F/u in 6 months       Relevant Orders   Hemoglobin A1c   Microalbumin / creatinine urine ratio   Hyperlipidemia associated with type 2 diabetes mellitus (HCC)    Previously well controlled Continue statin Repeat FLP and CMP Goal LDL < 70      Relevant Orders   Comprehensive metabolic panel   Lipid panel   Diabetic  neuropathy (HCC)    Chronic Much improved with daily massage Not on medications Continue to monitor        Other   Obesity    Discussed importance of healthy weight management Discussed diet and exercise       Other Visit Diagnoses     Flu vaccine need       Relevant Orders   Flu Vaccine QUAD High Dose(Fluad)        Return in about 6 months (around 08/12/2022) for AWV, CPE.      I, Lavon Paganini, MD, have reviewed all documentation for this visit. The documentation on 02/11/22 for the exam, diagnosis, procedures, and orders are all accurate and complete.   Athanasia Stanwood, Dionne Bucy, MD, MPH [facility]

## 2022-02-11 NOTE — Assessment & Plan Note (Signed)
Well controlled previously Recheck A1c Continue current medications UTD on vaccines, eye exam (upcoming), foot exam (completed today) UACR today On ACEi/ARB On Statin Discussed diet and exercise F/u in 6 months

## 2022-02-11 NOTE — Assessment & Plan Note (Signed)
Previously well controlled Continue statin Repeat FLP and CMP Goal LDL < 70 

## 2022-02-11 NOTE — Assessment & Plan Note (Signed)
Chronic Much improved with daily massage Not on medications Continue to monitor

## 2022-02-12 LAB — COMPREHENSIVE METABOLIC PANEL
ALT: 16 IU/L (ref 0–32)
AST: 16 IU/L (ref 0–40)
Albumin/Globulin Ratio: 2.4 — ABNORMAL HIGH (ref 1.2–2.2)
Albumin: 4.6 g/dL (ref 3.8–4.8)
Alkaline Phosphatase: 49 IU/L (ref 44–121)
BUN/Creatinine Ratio: 25 (ref 12–28)
BUN: 25 mg/dL (ref 8–27)
Bilirubin Total: 0.4 mg/dL (ref 0.0–1.2)
CO2: 26 mmol/L (ref 20–29)
Calcium: 9.8 mg/dL (ref 8.7–10.3)
Chloride: 98 mmol/L (ref 96–106)
Creatinine, Ser: 1 mg/dL (ref 0.57–1.00)
Globulin, Total: 1.9 g/dL (ref 1.5–4.5)
Glucose: 129 mg/dL — ABNORMAL HIGH (ref 70–99)
Potassium: 3.5 mmol/L (ref 3.5–5.2)
Sodium: 140 mmol/L (ref 134–144)
Total Protein: 6.5 g/dL (ref 6.0–8.5)
eGFR: 59 mL/min/{1.73_m2} — ABNORMAL LOW (ref >59)

## 2022-02-12 LAB — LIPID PANEL
Chol/HDL Ratio: 2.9 ratio (ref 0.0–4.4)
Cholesterol, Total: 162 mg/dL (ref 100–199)
HDL: 55 mg/dL (ref >39)
LDL Chol Calc (NIH): 82 mg/dL (ref 0–99)
Triglycerides: 147 mg/dL (ref 0–149)
VLDL Cholesterol Cal: 25 mg/dL (ref 5–40)

## 2022-02-12 LAB — MICROALBUMIN / CREATININE URINE RATIO
Creatinine, Urine: 68.3 mg/dL
Microalb/Creat Ratio: 26 mg/g creat (ref 0–29)
Microalbumin, Urine: 17.9 ug/mL

## 2022-02-12 LAB — HEMOGLOBIN A1C
Est. average glucose Bld gHb Est-mCnc: 140 mg/dL
Hgb A1c MFr Bld: 6.5 % — ABNORMAL HIGH (ref 4.8–5.6)

## 2022-03-05 DIAGNOSIS — H2512 Age-related nuclear cataract, left eye: Secondary | ICD-10-CM | POA: Diagnosis not present

## 2022-03-05 DIAGNOSIS — H40053 Ocular hypertension, bilateral: Secondary | ICD-10-CM | POA: Diagnosis not present

## 2022-03-05 DIAGNOSIS — E119 Type 2 diabetes mellitus without complications: Secondary | ICD-10-CM | POA: Diagnosis not present

## 2022-03-05 DIAGNOSIS — H43813 Vitreous degeneration, bilateral: Secondary | ICD-10-CM | POA: Diagnosis not present

## 2022-03-05 LAB — FECAL OCCULT BLOOD, IMMUNOCHEMICAL: IFOBT: NEGATIVE

## 2022-03-05 LAB — HM DIABETES EYE EXAM

## 2022-03-08 ENCOUNTER — Encounter: Payer: Self-pay | Admitting: Family Medicine

## 2022-03-21 ENCOUNTER — Other Ambulatory Visit: Payer: Self-pay | Admitting: Family Medicine

## 2022-04-22 ENCOUNTER — Other Ambulatory Visit: Payer: Self-pay | Admitting: Family Medicine

## 2022-04-22 DIAGNOSIS — E1142 Type 2 diabetes mellitus with diabetic polyneuropathy: Secondary | ICD-10-CM

## 2022-06-22 ENCOUNTER — Other Ambulatory Visit: Payer: Self-pay | Admitting: Family Medicine

## 2022-06-24 NOTE — Telephone Encounter (Signed)
Requested Prescriptions  Pending Prescriptions Disp Refills   atorvastatin (LIPITOR) 20 MG tablet [Pharmacy Med Name: ATORVASTATIN CALCIUM 20 MG TAB] 30 tablet 2    Sig: TAKE 1 TABLET BY MOUTH DAILY     Cardiovascular:  Antilipid - Statins Failed - 06/22/2022  9:05 AM      Failed - Lipid Panel in normal range within the last 12 months    Cholesterol, Total  Date Value Ref Range Status  02/11/2022 162 100 - 199 mg/dL Final   LDL Cholesterol (Calc)  Date Value Ref Range Status  12/18/2016 CANCELED      Comment:      Test not performed.         Duplicate test.               Result canceled by the ancillary.    LDL Chol Calc (NIH)  Date Value Ref Range Status  02/11/2022 82 0 - 99 mg/dL Final   HDL  Date Value Ref Range Status  02/11/2022 55 >39 mg/dL Final   Triglycerides  Date Value Ref Range Status  02/11/2022 147 0 - 149 mg/dL Final         Passed - Patient is not pregnant      Passed - Valid encounter within last 12 months    Recent Outpatient Visits           4 months ago Type 2 diabetes mellitus with diabetic polyneuropathy, without long-term current use of insulin (HCC)   [place] [facility] [place], Marzella Schlein, MD   10 months ago Type 2 diabetes mellitus with diabetic polyneuropathy, without long-term current use of insulin (HCC)   [place] [facility] Bosie Clos, MD   1 year ago Type 2 diabetes mellitus with diabetic polyneuropathy, without long-term current use of insulin [facility])   [place] [facility] [place], Marzella Schlein, MD   2 years ago Type 2 diabetes mellitus with diabetic polyneuropathy, without long-term current use of insulin [facility])   [place] [facility] [place], Marzella Schlein, MD   3 years ago Encounter for annual physical exam    [facility] [place], Marzella Schlein, MD       Future Appointments             In 1 month Bacigalupo, Marzella Schlein, MD  [facility], [facility]

## 2022-06-24 NOTE — Telephone Encounter (Signed)
Rx- 03/21/22 #90 1RF- too soon- future fill Rx requested Requested Prescriptions  Pending Prescriptions Disp Refills   amLODipine (NORVASC) 10 MG tablet [Pharmacy Med Name: AMLODIPINE BESYLATE 10 MG TAB] 90 tablet 1    Sig: TAKE 1 TABLET BY MOUTH DAILY     Cardiovascular: Calcium Channel Blockers 2 Passed - 06/22/2022  8:59 AM      Passed - Last BP in normal range    BP Readings from Last 1 Encounters:  02/11/22 130/70         Passed - Last Heart Rate in normal range    Pulse Readings from Last 1 Encounters:  02/11/22 71         Passed - Valid encounter within last 6 months    Recent Outpatient Visits           4 months ago Type 2 diabetes mellitus with diabetic polyneuropathy, without long-term current use of insulin (HCC)   [place] [facility] [place], Marzella Schlein, MD   10 months ago Type 2 diabetes mellitus with diabetic polyneuropathy, without long-term current use of insulin (HCC)   [facility] Bosie Clos, MD   1 year ago Type 2 diabetes mellitus with diabetic polyneuropathy, without long-term current use of insulin (HCC)   [place] [facility] [place], Marzella Schlein, MD   2 years ago Type 2 diabetes mellitus with diabetic polyneuropathy, without long-term current use of insulin (HCC)   [place] [facility] [place], Marzella Schlein, MD   3 years ago Encounter for annual physical exam    [facility] [place], Marzella Schlein, MD       Future Appointments             In 1 month Bacigalupo, Marzella Schlein, MD [facility], [facility]             lisinopril-hydrochlorothiazide (ZESTORETIC) 20-12.5 MG tablet [Pharmacy Med Name: LISINOPRIL-HCTZ 20-12.5 MG TAB] 180 tablet 1    Sig: TAKE 2 TABLETS BY MOUTH DAILY     Cardiovascular:  ACEI + Diuretic Combos Passed - 06/22/2022  8:59 AM      Passed - Na in normal range and within 180 days    Sodium  Date  Value Ref Range Status  02/11/2022 140 134 - 144 mmol/L Final  01/11/2013 135 (L) 136 - 145 mmol/L Final         Passed - K in normal range and within 180 days    Potassium  Date Value Ref Range Status  02/11/2022 3.5 3.5 - 5.2 mmol/L Final  01/11/2013 3.4 (L) 3.5 - 5.1 mmol/L Final         Passed - Cr in normal range and within 180 days    Creatinine  Date Value Ref Range Status  01/11/2013 0.92 0.60 - 1.30 mg/dL Final   Creatinine, Ser  Date Value Ref Range Status  02/11/2022 1.00 0.57 - 1.00 mg/dL Final         Passed - eGFR is 30 or above and within 180 days    EGFR (African American)  Date Value Ref Range Status  01/11/2013 >60  Final   GFR calc Af Amer  Date Value Ref Range Status  04/21/2019 73 >59 mL/min/1.73 Final   EGFR (Non-African Amer.)  Date Value Ref Range Status  01/11/2013 >60  Final    Comment:    eGFR values <38mL/min/1.73 m2 may be an indication of chronic kidney disease (  CKD). Calculated eGFR is useful in patients with stable renal function. The eGFR calculation will not be reliable in acutely ill patients when serum creatinine is changing rapidly. It is not useful in  patients on dialysis. The eGFR calculation may not be applicable to patients at the low and high extremes of body sizes, pregnant women, and vegetarians.    GFR calc non Af Amer  Date Value Ref Range Status  04/21/2019 63 >59 mL/min/1.73 Final   eGFR  Date Value Ref Range Status  02/11/2022 59 (L) >59 mL/min/1.73 Final         Passed - Patient is not pregnant      Passed - Last BP in normal range    BP Readings from Last 1 Encounters:  02/11/22 130/70         Passed - Valid encounter within last 6 months    Recent Outpatient Visits           4 months ago Type 2 diabetes mellitus with diabetic polyneuropathy, without long-term current use of insulin (HCC)   [place] [facility] [place], Marzella Schlein, MD   10 months ago Type 2 diabetes mellitus with  diabetic polyneuropathy, without long-term current use of insulin (HCC)   [place] [facility] Bosie Clos, MD   1 year ago Type 2 diabetes mellitus with diabetic polyneuropathy, without long-term current use of insulin [facility])   [place] [facility] [place], Marzella Schlein, MD   2 years ago Type 2 diabetes mellitus with diabetic polyneuropathy, without long-term current use of insulin [facility])   [place] [place], Marzella Schlein, MD   3 years ago Encounter for annual physical exam   [place] [facility] [place], Marzella Schlein, MD       Future Appointments             In 1 month Bacigalupo, Marzella Schlein, MD [facility]

## 2022-08-20 ENCOUNTER — Encounter: Payer: Self-pay | Admitting: Family Medicine

## 2022-08-20 ENCOUNTER — Ambulatory Visit (INDEPENDENT_AMBULATORY_CARE_PROVIDER_SITE_OTHER): Payer: PPO | Admitting: Family Medicine

## 2022-08-20 VITALS — BP 115/75 | HR 65 | Temp 98.0°F | Resp 13 | Ht 62.5 in | Wt 171.3 lb

## 2022-08-20 DIAGNOSIS — E1159 Type 2 diabetes mellitus with other circulatory complications: Secondary | ICD-10-CM | POA: Diagnosis not present

## 2022-08-20 DIAGNOSIS — Z0000 Encounter for general adult medical examination without abnormal findings: Secondary | ICD-10-CM | POA: Diagnosis not present

## 2022-08-20 DIAGNOSIS — E1169 Type 2 diabetes mellitus with other specified complication: Secondary | ICD-10-CM

## 2022-08-20 DIAGNOSIS — E1142 Type 2 diabetes mellitus with diabetic polyneuropathy: Secondary | ICD-10-CM | POA: Diagnosis not present

## 2022-08-20 DIAGNOSIS — E669 Obesity, unspecified: Secondary | ICD-10-CM | POA: Diagnosis not present

## 2022-08-20 DIAGNOSIS — Z6830 Body mass index (BMI) 30.0-30.9, adult: Secondary | ICD-10-CM | POA: Diagnosis not present

## 2022-08-20 DIAGNOSIS — I152 Hypertension secondary to endocrine disorders: Secondary | ICD-10-CM | POA: Diagnosis not present

## 2022-08-20 DIAGNOSIS — E785 Hyperlipidemia, unspecified: Secondary | ICD-10-CM | POA: Diagnosis not present

## 2022-08-20 MED ORDER — LISINOPRIL-HYDROCHLOROTHIAZIDE 20-12.5 MG PO TABS: ORAL_TABLET | ORAL | 1 refills | Status: DC

## 2022-08-20 MED ORDER — METFORMIN HCL 1000 MG PO TABS
1000.0000 mg | ORAL_TABLET | Freq: Two times a day (BID) | ORAL | 3 refills | Status: DC
Start: 2022-08-20 — End: 2023-02-25

## 2022-08-20 MED ORDER — AMLODIPINE BESYLATE 10 MG PO TABS: ORAL_TABLET | ORAL | 1 refills | Status: DC

## 2022-08-20 MED ORDER — ATORVASTATIN CALCIUM 20 MG PO TABS: 20.0000 mg | ORAL_TABLET | Freq: Every day | ORAL | 2 refills | Status: DC

## 2022-08-20 NOTE — Assessment & Plan Note (Signed)
 Previously well controlled Continue statin Repeat FLP and CMP Goal LDL < 70 

## 2022-08-20 NOTE — Progress Notes (Signed)
Annual Wellness Visit   [name], Female    DOB: 05-20-1950, 72 y.o.   MRN: [id number]  Subjective  Chief Complaint  Patient presents with   Diabetes   Hypertension    Karen Olsen is a 72 y.o. female who presents today for her Annual Wellness Visit. She reports consuming a general diet.     She generally feels well. She reports sleeping well. She does not have additional problems to discuss today.   Diabetes  Hypertension    Discussed the use of AI scribe software for clinical note transcription with the patient, who gave verbal consent to proceed.  History of Present Illness   The patient, with a history of diabetes and high cholesterol, presents for a routine check-up. She expresses concern about the COVID-19 vaccine she received in January, which caused her arm to swell significantly and was painful for about a week. The patient notes that she had received the Pfizer vaccine previously without any issues, but the January vaccine, which was an updated version for the new variant, caused the adverse reaction.  The patient also discusses her general health and wellness, stating that she is compliant with her current medications, metformin for diabetes and atorvastatin for cholesterol. She expresses a preference for avoiding certain screenings and tests, including colon cancer screening, mammogram, and bone density scan, due to personal beliefs and concerns about potential side effects of medications.  The patient also mentions a history of chickenpox at the age of 62, which was a severe case that covered her entire body. She expresses reluctance to get the shingles vaccine, despite acknowledging the potential risk.            Medications: Outpatient Medications Prior to Visit  Medication Sig   acetaminophen (TYLENOL) 500 MG tablet Take 500 mg by mouth every 6 (six) hours as needed.   Coenzyme Q10 (COQ10) 200 MG CAPS Take 1 capsule by mouth daily. (Patient  taking differently: Take 100 mg by mouth daily.)   glucose blood test strip RELION CONFIRM/MICRO TEST (In Vitro Strip)  1 (one) Strip Strip daily for 0 days  Quantity: 100;  Refills: 3   Ordered :29-July-2012  Letta Kocher ;  Started 29-July-2012 Active   Multiple Vitamins-Minerals (MULTIVITAMIN ADULTS PO) Take by mouth daily.    Omega-3 Fatty Acids (FISH OIL) 1000 MG CAPS Take by mouth. Reported on 02/22/2015   [DISCONTINUED] amLODipine (NORVASC) 10 MG tablet TAKE ONE TABLET BY MOUTH DAILY   [DISCONTINUED] atorvastatin (LIPITOR) 20 MG tablet TAKE 1 TABLET BY MOUTH DAILY   [DISCONTINUED] lisinopril-hydrochlorothiazide (ZESTORETIC) 20-12.5 MG tablet TAKE 2 TABLETS BY MOUTH DAILY   [DISCONTINUED] metFORMIN (GLUCOPHAGE) 1000 MG tablet TAKE 1 TABLET BY MOUTH TWICE DAILY   No facility-administered medications prior to visit.    Allergies  Allergen Reactions   Phosphate Laxative  [Sodium Phosphate]     Phosphosoda- Bad stomach cramps   Ceclor  [Cefaclor] Rash   Penicillins Rash    Patient Care Team: Erasmo Downer, MD as PCP - General (Family Medicine) Isla Pence, OD (Optometry)  ROS per HPI      Objective  BP 115/75 (BP Location: Left Arm, Patient Position: Sitting, Cuff Size: Large)   Pulse 65   Temp 98 F (36.7 C) (Oral)   Resp 13   Ht 5' 2.5" (1.588 m)   Wt 171 lb 4.8 oz (77.7 kg)   SpO2 99%   BMI 30.83 kg/m    Physical Exam Vitals  reviewed.  Constitutional:      General: She is not in acute distress.    Appearance: Normal appearance. She is well-developed. She is not diaphoretic.  HENT:     Head: Normocephalic and atraumatic.     Right Ear: Tympanic membrane, ear canal and external ear normal.     Left Ear: Tympanic membrane, ear canal and external ear normal.     Nose: Nose normal.     Mouth/Throat:     Mouth: Mucous membranes are moist.     Pharynx: Oropharynx is clear. No oropharyngeal exudate.  Eyes:     General: No scleral icterus.     Conjunctiva/sclera: Conjunctivae normal.     Pupils: Pupils are equal, round, and reactive to light.  Neck:     Thyroid: No thyromegaly.  Cardiovascular:     Rate and Rhythm: Normal rate and regular rhythm.     Heart sounds: Normal heart sounds. No murmur heard. Pulmonary:     Effort: Pulmonary effort is normal. No respiratory distress.     Breath sounds: Normal breath sounds. No wheezing or rales.  Abdominal:     General: There is no distension.     Palpations: Abdomen is soft.     Tenderness: There is no abdominal tenderness.  Musculoskeletal:        General: No deformity.     Cervical back: Neck supple.     Right lower leg: No edema.     Left lower leg: No edema.  Lymphadenopathy:     Cervical: No cervical adenopathy.  Skin:    General: Skin is warm and dry.     Findings: No rash.  Neurological:     Mental Status: She is alert and oriented to person, place, and time. Mental status is at baseline.     Gait: Gait normal.  Psychiatric:        Mood and Affect: Mood normal.        Behavior: Behavior normal.        Thought Content: Thought content normal.       Most recent functional status assessment:    08/20/2022    8:44 AM  In your present state of health, do you have any difficulty performing the following activities:  Hearing? 0  Vision? 0  Difficulty concentrating or making decisions? 0  Walking or climbing stairs? 0  Dressing or bathing? 0  Doing errands, shopping? 0   Most recent fall risk assessment:    08/20/2022    8:44 AM  Fall Risk   Falls in the past year? 0    Most recent depression screenings:    08/20/2022    8:29 AM 02/11/2022    8:19 AM  PHQ 2/9 Scores  PHQ - 2 Score 0 0  PHQ- 9 Score  0   Most recent cognitive screening:    04/08/2018   10:19 AM  6CIT Screen  What Year? 0 points  What month? 0 points  What time? 0 points  Count back from 20 0 points  Months in reverse 0 points  Repeat phrase 0 points  Total Score 0 points   Most  recent Audit-C alcohol use screening    02/11/2022    8:19 AM  Alcohol Use Disorder Test (AUDIT)  1. How often do you have a drink containing alcohol? 1  2. How many drinks containing alcohol do you have on a typical day when you are drinking? 0  3. How often do you have six or  more drinks on one occasion? 0  AUDIT-C Score 1   A score of 3 or more in women, and 4 or more in men indicates increased risk for alcohol abuse, EXCEPT if all of the points are from question 1   Vision/Hearing Screen: No results found.    No results found for any visits on 08/20/22.    Assessment & Plan   Annual wellness visit done today including the all of the following: Reviewed patient's Family Medical History Reviewed and updated list of patient's medical providers Assessment of cognitive impairment was done Assessed patient's functional ability Established a written schedule for health screening services Health Risk Assessent Completed and Reviewed  Exercise Activities and Dietary recommendations  Goals      Have 3 meals a day     Recommend to decrease portion sizes by eating 3 small healthy meals and at least 2 healthy snacks per day.        Immunization History  Administered Date(s) Administered   COVID-19, mRNA, vaccine(Comirnaty)12 years and older 02/11/2022   Fluad Quad(high Dose 65+) 10/14/2018, 02/13/2021, 02/11/2022   Influenza Split 11/30/2005, 10/14/2009, 12/18/2011   Influenza, High Dose Seasonal PF 11/30/2014, 11/29/2015, 11/02/2016, 11/29/2017   Influenza,inj,Quad PF,6+ Mos 11/04/2012, 10/13/2013   PFIZER(Purple Top)SARS-COV-2 Vaccination 03/15/2019, 04/08/2019   Pneumococcal Conjugate-13 01/05/2014   Pneumococcal Polysaccharide-23 12/01/2004, 11/29/2015   Td 12/26/1995, 12/18/2011   Tdap 12/18/2011    Health Maintenance  Topic Date Due   Zoster Vaccines- Shingrix (1 of 2) Never done   Colonoscopy  Never done   MAMMOGRAM  03/06/2019   DTaP/Tdap/Td (4 - Td or Tdap)  12/17/2021   COVID-19 Vaccine (4 - 2023-24 season) 04/08/2022   HEMOGLOBIN A1C  08/12/2022   INFLUENZA VACCINE  09/05/2022   Diabetic kidney evaluation - eGFR measurement  02/12/2023   Diabetic kidney evaluation - Urine ACR  02/12/2023   FOOT EXAM  02/12/2023   OPHTHALMOLOGY EXAM  03/06/2023   COLON CANCER SCREENING ANNUAL FOBT  03/06/2023   Medicare Annual Wellness (AWV)  08/20/2023   Pneumonia Vaccine 59+ Years old  Completed   DEXA SCAN  Completed   Hepatitis C Screening  Completed   HPV VACCINES  Aged Out     Discussed health benefits of physical activity, and encouraged her to engage in regular exercise appropriate for her age and condition.    Problem List Items Addressed This Visit       Cardiovascular and Mediastinum   Hypertension associated with diabetes (HCC)    Well controlled Continue current medications Recheck metabolic panel F/u in 6 months       Relevant Medications   amLODipine (NORVASC) 10 MG tablet   atorvastatin (LIPITOR) 20 MG tablet   lisinopril-hydrochlorothiazide (ZESTORETIC) 20-12.5 MG tablet   metFORMIN (GLUCOPHAGE) 1000 MG tablet   Other Relevant Orders   Comprehensive metabolic panel   Lipid panel     Endocrine   Diabetes mellitus, type 2 (HCC)    Well controlled previously Recheck A1c Continue current medications UTD on vaccines, eye exam, foot exam On ACEi/ARB On Statin Discussed diet and exercise F/u in 6 months       Relevant Medications   atorvastatin (LIPITOR) 20 MG tablet   lisinopril-hydrochlorothiazide (ZESTORETIC) 20-12.5 MG tablet   metFORMIN (GLUCOPHAGE) 1000 MG tablet   Other Relevant Orders   Comprehensive metabolic panel   Lipid panel   Hemoglobin A1c   Hyperlipidemia associated with type 2 diabetes mellitus (HCC)    Previously well controlled Continue statin  Repeat FLP and CMP Goal LDL < 70      Relevant Medications   amLODipine (NORVASC) 10 MG tablet   atorvastatin (LIPITOR) 20 MG tablet    lisinopril-hydrochlorothiazide (ZESTORETIC) 20-12.5 MG tablet   metFORMIN (GLUCOPHAGE) 1000 MG tablet   Other Relevant Orders   Comprehensive metabolic panel   Lipid panel     Other   Obesity    Discussed importance of healthy weight management Discussed diet and exercise       Relevant Medications   metFORMIN (GLUCOPHAGE) 1000 MG tablet   Other Relevant Orders   Comprehensive metabolic panel   Lipid panel   Hemoglobin A1c   Other Visit Diagnoses     Encounter for annual wellness visit (AWV) in Medicare patient    -  Primary   Encounter for annual physical exam       Relevant Orders   Comprehensive metabolic panel   Lipid panel   Hemoglobin A1c           COVID-19 Vaccination: Received updated Pfizer vaccine in January with significant local reaction. Discussed that the reaction was likely due to the updated formulation rather than a change in manufacturer. -Plan to receive updated COVID-19 booster in the fall.  General Health Maintenance: Declined colon cancer screening, mammogram, and bone density scan. Discussed the importance of these screenings and the potential risks of declining them. -Obtain tetanus shot at pharmacy. -Consider shingles vaccination. -Order labs for A1C, cholesterol, and kidney and liver function.  Follow-up in six months.        Return in about 6 months (around 02/20/2023) for chronic disease f/u.     Shirlee Latch, MD

## 2022-08-20 NOTE — Assessment & Plan Note (Signed)
 Well controlled Continue current medications Recheck metabolic panel F/u in 6 months  

## 2022-08-20 NOTE — Assessment & Plan Note (Signed)
 Discussed importance of healthy weight management Discussed diet and exercise  

## 2022-08-20 NOTE — Assessment & Plan Note (Signed)
Well controlled previously Recheck A1c Continue current medications UTD on vaccines, eye exam, foot exam On ACEi/ARB On Statin Discussed diet and exercise F/u in 6 months

## 2022-08-21 LAB — COMPREHENSIVE METABOLIC PANEL
ALT: 14 IU/L (ref 0–32)
AST: 22 IU/L (ref 0–40)
Albumin: 4.5 g/dL (ref 3.8–4.8)
Alkaline Phosphatase: 53 IU/L (ref 44–121)
BUN/Creatinine Ratio: 24 (ref 12–28)
BUN: 30 mg/dL — ABNORMAL HIGH (ref 8–27)
Bilirubin Total: 0.4 mg/dL (ref 0.0–1.2)
CO2: 22 mmol/L (ref 20–29)
Calcium: 10 mg/dL (ref 8.7–10.3)
Chloride: 97 mmol/L (ref 96–106)
Creatinine, Ser: 1.25 mg/dL — ABNORMAL HIGH (ref 0.57–1.00)
Globulin, Total: 2.3 g/dL (ref 1.5–4.5)
Glucose: 129 mg/dL — ABNORMAL HIGH (ref 70–99)
Potassium: 4.6 mmol/L (ref 3.5–5.2)
Sodium: 139 mmol/L (ref 134–144)
Total Protein: 6.8 g/dL (ref 6.0–8.5)
eGFR: 46 mL/min/{1.73_m2} — ABNORMAL LOW (ref >59)

## 2022-08-21 LAB — HEMOGLOBIN A1C
Est. average glucose Bld gHb Est-mCnc: 146 mg/dL
Hgb A1c MFr Bld: 6.7 % — ABNORMAL HIGH (ref 4.8–5.6)

## 2022-08-21 LAB — LIPID PANEL
Chol/HDL Ratio: 2.9 ratio (ref 0.0–4.4)
Cholesterol, Total: 149 mg/dL (ref 100–199)
HDL: 52 mg/dL (ref >39)
LDL Chol Calc (NIH): 73 mg/dL (ref 0–99)
Triglycerides: 136 mg/dL (ref 0–149)
VLDL Cholesterol Cal: 24 mg/dL (ref 5–40)

## 2022-08-29 ENCOUNTER — Telehealth: Payer: Self-pay

## 2022-08-29 DIAGNOSIS — E1159 Type 2 diabetes mellitus with other circulatory complications: Secondary | ICD-10-CM

## 2022-08-29 NOTE — Telephone Encounter (Signed)
Seen by patient Karen Olsen on 08/29/2022  8:54 AM

## 2022-08-29 NOTE — Telephone Encounter (Signed)
-----   Message from Shirlee Latch sent at 08/23/2022  8:22 AM EDT ----- Normal/stable labs, except elevated kidney function.  Recommend hydrating well, holding any NSAIDs (ibuprofen, aleve, etc), and recheck in 2 weeks.

## 2022-09-09 DIAGNOSIS — I152 Hypertension secondary to endocrine disorders: Secondary | ICD-10-CM | POA: Diagnosis not present

## 2022-09-09 DIAGNOSIS — E1159 Type 2 diabetes mellitus with other circulatory complications: Secondary | ICD-10-CM | POA: Diagnosis not present

## 2022-12-11 ENCOUNTER — Other Ambulatory Visit: Payer: Self-pay | Admitting: Family Medicine

## 2023-02-24 NOTE — Progress Notes (Unsigned)
      Established patient visit   [name]   DOB: 1951-02-03   73 y.o. Female  MRN: [id number] Visit Date: 02/25/2023  Today's healthcare provider: Shirlee Latch, MD   No chief complaint on file.  Subjective    HPI   Discussed the use of AI scribe software for clinical note transcription with the patient, who gave verbal consent to proceed.  History of Present Illness             Medications: Outpatient Medications Prior to Visit  Medication Sig   acetaminophen (TYLENOL) 500 MG tablet Take 500 mg by mouth every 6 (six) hours as needed.   amLODipine (NORVASC) 10 MG tablet TAKE 1 TABLET BY MOUTH DAILY   atorvastatin (LIPITOR) 20 MG tablet Take 1 tablet (20 mg total) by mouth daily.   Coenzyme Q10 (COQ10) 200 MG CAPS Take 1 capsule by mouth daily. (Patient taking differently: Take 100 mg by mouth daily.)   glucose blood test strip RELION CONFIRM/MICRO TEST (In Vitro Strip)  1 (one) Strip Strip daily for 0 days  Quantity: 100;  Refills: 3   Ordered :29-July-2012  Letta Kocher ;  Started 29-July-2012 Active   lisinopril-hydrochlorothiazide (ZESTORETIC) 20-12.5 MG tablet TAKE 2 TABLETS BY MOUTH DAILY   metFORMIN (GLUCOPHAGE) 1000 MG tablet Take 1 tablet (1,000 mg total) by mouth 2 (two) times daily.   Multiple Vitamins-Minerals (MULTIVITAMIN ADULTS PO) Take by mouth daily.    Omega-3 Fatty Acids (FISH OIL) 1000 MG CAPS Take by mouth. Reported on 02/22/2015   No facility-administered medications prior to visit.    Review of Systems {Insert previous labs (optional):23779} {See past labs  Heme  Chem  Endocrine  Serology  Results Review (optional):1}   Objective    There were no vitals taken for this visit. {Insert last BP/Wt (optional):23777}{See vitals history (optional):1}  Physical Exam   No results found for any visits on 02/25/23.  Assessment & Plan     Problem List Items Addressed This Visit   None   Assessment and Plan                No follow-ups on file.       Shirlee Latch, MD  [facility] [phone] (phone) [phone] (fax)  [facility]

## 2023-02-25 ENCOUNTER — Encounter: Payer: Self-pay | Admitting: Family Medicine

## 2023-02-25 ENCOUNTER — Ambulatory Visit (INDEPENDENT_AMBULATORY_CARE_PROVIDER_SITE_OTHER): Payer: PPO | Admitting: Family Medicine

## 2023-02-25 VITALS — BP 132/68 | HR 64 | Ht 62.0 in | Wt 175.0 lb

## 2023-02-25 DIAGNOSIS — E785 Hyperlipidemia, unspecified: Secondary | ICD-10-CM

## 2023-02-25 DIAGNOSIS — Z7984 Long term (current) use of oral hypoglycemic drugs: Secondary | ICD-10-CM

## 2023-02-25 DIAGNOSIS — Z23 Encounter for immunization: Secondary | ICD-10-CM

## 2023-02-25 DIAGNOSIS — E1169 Type 2 diabetes mellitus with other specified complication: Secondary | ICD-10-CM

## 2023-02-25 DIAGNOSIS — I152 Hypertension secondary to endocrine disorders: Secondary | ICD-10-CM | POA: Diagnosis not present

## 2023-02-25 DIAGNOSIS — E1159 Type 2 diabetes mellitus with other circulatory complications: Secondary | ICD-10-CM | POA: Diagnosis not present

## 2023-02-25 DIAGNOSIS — E1142 Type 2 diabetes mellitus with diabetic polyneuropathy: Secondary | ICD-10-CM

## 2023-02-25 MED ORDER — LISINOPRIL-HYDROCHLOROTHIAZIDE 20-12.5 MG PO TABS: ORAL_TABLET | ORAL | 3 refills | Status: DC

## 2023-02-25 MED ORDER — AMLODIPINE BESYLATE 10 MG PO TABS: ORAL_TABLET | ORAL | 3 refills | Status: DC

## 2023-02-25 MED ORDER — METFORMIN HCL 1000 MG PO TABS: 1000.0000 mg | ORAL_TABLET | Freq: Two times a day (BID) | ORAL | 3 refills | Status: AC

## 2023-02-25 MED ORDER — ATORVASTATIN CALCIUM 20 MG PO TABS: 20.0000 mg | ORAL_TABLET | Freq: Every day | ORAL | 3 refills | Status: DC

## 2023-02-25 NOTE — Assessment & Plan Note (Signed)
Reports decreased sensation in big toes and adjacent toes. Annual foot exam performed today. Discussed importance of foot care to prevent ulcers and infections. - Continue current management

## 2023-02-25 NOTE — Assessment & Plan Note (Signed)
Managed with metformin 1000 mg BID. Annual labs including A1c and kidney function tests are due. Discussed regular monitoring to prevent complications such as nephropathy and retinopathy. - Order A1c test - Order kidney function tests - UACR - Refill metformin

## 2023-02-25 NOTE — Assessment & Plan Note (Signed)
Blood pressure readings at home fluctuate between 120-140/60-80 mmHg. Current regimen includes amlodipine 10 mg daily and lisinopril-HCTZ 20-12.5 mg daily. Today's blood pressure was well controlled. Emphasized maintaining blood pressure under 140/90 mmHg to reduce cardiovascular risk. - Recheck blood pressure - Continue current antihypertensive medications - Refill medications

## 2023-02-25 NOTE — Assessment & Plan Note (Signed)
Managed with atorvastatin 20 mg daily. Annual cholesterol levels are due. Discussed benefits of maintaining LDL below 100 mg/dL to reduce atherosclerotic cardiovascular disease risk. - Order cholesterol panel - Continue atorvastatin 20 mg daily

## 2023-02-27 ENCOUNTER — Encounter: Payer: Self-pay | Admitting: Family Medicine

## 2023-02-27 LAB — LIPID PANEL
Chol/HDL Ratio: 2.9 {ratio} (ref 0.0–4.4)
Cholesterol, Total: 165 mg/dL (ref 100–199)
HDL: 57 mg/dL (ref >39)
LDL Chol Calc (NIH): 78 mg/dL (ref 0–99)
Triglycerides: 180 mg/dL — ABNORMAL HIGH (ref 0–149)
VLDL Cholesterol Cal: 30 mg/dL (ref 5–40)

## 2023-02-27 LAB — COMPREHENSIVE METABOLIC PANEL
ALT: 15 [IU]/L (ref 0–32)
AST: 18 [IU]/L (ref 0–40)
Albumin: 4.6 g/dL (ref 3.8–4.8)
Alkaline Phosphatase: 55 [IU]/L (ref 44–121)
BUN/Creatinine Ratio: 21 (ref 12–28)
BUN: 27 mg/dL (ref 8–27)
Bilirubin Total: 0.3 mg/dL (ref 0.0–1.2)
CO2: 23 mmol/L (ref 20–29)
Calcium: 10 mg/dL (ref 8.7–10.3)
Chloride: 100 mmol/L (ref 96–106)
Creatinine, Ser: 1.28 mg/dL — ABNORMAL HIGH (ref 0.57–1.00)
Globulin, Total: 2.3 g/dL (ref 1.5–4.5)
Glucose: 144 mg/dL — ABNORMAL HIGH (ref 70–99)
Potassium: 4 mmol/L (ref 3.5–5.2)
Sodium: 141 mmol/L (ref 134–144)
Total Protein: 6.9 g/dL (ref 6.0–8.5)
eGFR: 44 mL/min/{1.73_m2} — ABNORMAL LOW (ref >59)

## 2023-02-27 LAB — MICROALBUMIN / CREATININE URINE RATIO
Creatinine, Urine: 45.4 mg/dL
Microalb/Creat Ratio: 35 mg/g{creat} — ABNORMAL HIGH (ref 0–29)
Microalbumin, Urine: 15.9 ug/mL

## 2023-02-27 LAB — HEMOGLOBIN A1C
Est. average glucose Bld gHb Est-mCnc: 148 mg/dL
Hgb A1c MFr Bld: 6.8 % — ABNORMAL HIGH (ref 4.8–5.6)

## 2023-03-11 DIAGNOSIS — E119 Type 2 diabetes mellitus without complications: Secondary | ICD-10-CM | POA: Diagnosis not present

## 2023-03-11 DIAGNOSIS — H2513 Age-related nuclear cataract, bilateral: Secondary | ICD-10-CM | POA: Diagnosis not present

## 2023-03-11 DIAGNOSIS — H40053 Ocular hypertension, bilateral: Secondary | ICD-10-CM | POA: Diagnosis not present

## 2023-03-11 DIAGNOSIS — H04123 Dry eye syndrome of bilateral lacrimal glands: Secondary | ICD-10-CM | POA: Diagnosis not present

## 2023-03-11 LAB — HM DIABETES EYE EXAM

## 2023-09-23 ENCOUNTER — Ambulatory Visit (INDEPENDENT_AMBULATORY_CARE_PROVIDER_SITE_OTHER): Payer: Self-pay | Admitting: Family Medicine

## 2023-09-23 VITALS — BP 133/61 | HR 66 | Ht 62.0 in | Wt 174.0 lb

## 2023-09-23 DIAGNOSIS — E1142 Type 2 diabetes mellitus with diabetic polyneuropathy: Secondary | ICD-10-CM

## 2023-09-23 DIAGNOSIS — Z6830 Body mass index (BMI) 30.0-30.9, adult: Secondary | ICD-10-CM | POA: Diagnosis not present

## 2023-09-23 DIAGNOSIS — Z0000 Encounter for general adult medical examination without abnormal findings: Secondary | ICD-10-CM

## 2023-09-23 DIAGNOSIS — E785 Hyperlipidemia, unspecified: Secondary | ICD-10-CM

## 2023-09-23 DIAGNOSIS — I152 Hypertension secondary to endocrine disorders: Secondary | ICD-10-CM | POA: Diagnosis not present

## 2023-09-23 DIAGNOSIS — E66811 Obesity, class 1: Secondary | ICD-10-CM | POA: Diagnosis not present

## 2023-09-23 DIAGNOSIS — Z905 Acquired absence of kidney: Secondary | ICD-10-CM | POA: Insufficient documentation

## 2023-09-23 DIAGNOSIS — N1831 Chronic kidney disease, stage 3a: Secondary | ICD-10-CM | POA: Diagnosis not present

## 2023-09-23 DIAGNOSIS — Z7984 Long term (current) use of oral hypoglycemic drugs: Secondary | ICD-10-CM

## 2023-09-23 DIAGNOSIS — E1159 Type 2 diabetes mellitus with other circulatory complications: Secondary | ICD-10-CM | POA: Diagnosis not present

## 2023-09-23 DIAGNOSIS — E1169 Type 2 diabetes mellitus with other specified complication: Secondary | ICD-10-CM | POA: Diagnosis not present

## 2023-09-23 NOTE — Patient Instructions (Signed)
 Consider asking at the pharmacy about the tetanus shot (TDAP) and the shingles shot (Shingrix)  ?

## 2023-09-23 NOTE — Assessment & Plan Note (Signed)
 Currently well controlled on medication regimen. Denies any symptoms or adverse medication side effects. Last A1C was stable at 6.8. - Continue Metformin  1000 mg twice daily - Order A1C

## 2023-09-23 NOTE — Assessment & Plan Note (Addendum)
 Currently well controlled on medication regimen. Denies any symptoms or adverse medication side effects. In office BP 133/61 with similar home measurements. Last CMP was slightly elevated but stable. - Continue amlodipine  10 mg - Continue lisinopril -hydrochlorothiazide  20-12.5 daily - Order CMP

## 2023-09-23 NOTE — Assessment & Plan Note (Signed)
 Currently managed with lifestyle modifications. Weight has remained stable between visits, around 175 lbs. - Continue lifestyle modifications - Discussed importance of healthy weight management

## 2023-09-23 NOTE — Assessment & Plan Note (Addendum)
 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 Last CMP was abnormal but stable within patient's baseline. - Order CMP - Continue to monitor

## 2023-09-23 NOTE — Progress Notes (Signed)
 Annual Wellness Visit     [name], Female    DOB: 11-05-1950, 73 y.o.   MRN: [id number]  Subjective  Chief Complaint  Patient presents with   Annual Exam   Care Management    Pattern of eating:General  Are you exercising:yes  What type of exercising: walking  How long:2 miles   How frequent: everyday    Vaccine: agreed to tdap   Denied: Mammo and Colonoscopy        Karen Olsen is a 73 y.o. female who presents today for her Annual Wellness Visit. She reports consuming a diabetic, low calorie diet. Home exercise routine includes walking 0.5 hrs per day. She generally feels well. She reports sleeping well. She does not have additional problems to discuss today.   HPI       Medications: Outpatient Medications Prior to Visit  Medication Sig   acetaminophen (TYLENOL) 500 MG tablet Take 500 mg by mouth every 6 (six) hours as needed.   amLODipine  (NORVASC ) 10 MG tablet TAKE 1 TABLET BY MOUTH DAILY   atorvastatin  (LIPITOR) 20 MG tablet Take 1 tablet (20 mg total) by mouth daily.   Coenzyme Q10 (COQ10) 200 MG CAPS Take 1 capsule by mouth daily. (Patient taking differently: Take 100 mg by mouth daily.)   glucose blood test strip RELION CONFIRM/MICRO TEST (In Vitro Strip)  1 (one) Strip Strip daily for 0 days  Quantity: 100;  Refills: 3   Ordered :29-July-2012  Ruther Setter ;  Started 29-July-2012 Active   lisinopril -hydrochlorothiazide  (ZESTORETIC ) 20-12.5 MG tablet TAKE 2 TABLETS BY MOUTH DAILY.   metFORMIN  (GLUCOPHAGE ) 1000 MG tablet Take 1 tablet (1,000 mg total) by mouth 2 (two) times daily.   Multiple Vitamins-Minerals (MULTIVITAMIN ADULTS PO) Take by mouth daily.    Omega-3 Fatty Acids (FISH OIL) 1000 MG CAPS Take by mouth. Reported on 02/22/2015   No facility-administered medications prior to visit.    Allergies  Allergen Reactions   Phosphate Laxative  [Sodium Phosphate]     Phosphosoda- Bad stomach cramps   Ceclor  [Cefaclor] Rash    Penicillins Rash    Patient Care Team: Khali Albanese M, MD as PCP - General (Family Medicine) Mevelyn JONETTA Bathe, OD (Optometry)  ROS      Objective  BP 133/61   Pulse 66   Ht 5' 2 (1.575 m)   Wt 174 lb (78.9 kg)   SpO2 100%   BMI 31.83 kg/m    Physical Exam Vitals reviewed.  Constitutional:      General: She is not in acute distress.    Appearance: Normal appearance. She is not ill-appearing or diaphoretic.  HENT:     Head: Normocephalic.     Right Ear: Tympanic membrane, ear canal and external ear normal.     Left Ear: Tympanic membrane, ear canal and external ear normal.     Nose: Nose normal.     Mouth/Throat:     Mouth: Mucous membranes are moist.     Pharynx: Oropharynx is clear. No oropharyngeal exudate or posterior oropharyngeal erythema.  Eyes:     General: No scleral icterus.    Conjunctiva/sclera: Conjunctivae normal.     Pupils: Pupils are equal, round, and reactive to light.  Cardiovascular:     Rate and Rhythm: Normal rate and regular rhythm.     Pulses: Normal pulses.     Heart sounds: Normal heart sounds. No murmur heard.    No friction rub. No gallop.  Pulmonary:  Effort: Pulmonary effort is normal. No respiratory distress.     Breath sounds: Normal breath sounds. No wheezing.  Abdominal:     General: There is no distension.     Palpations: Abdomen is soft.     Tenderness: There is no abdominal tenderness. There is no guarding.  Musculoskeletal:     Cervical back: Normal range of motion.     Right lower leg: No edema.     Left lower leg: No edema.  Lymphadenopathy:     Cervical: No cervical adenopathy.  Skin:    General: Skin is warm and dry.     Capillary Refill: Capillary refill takes less than 2 seconds.  Neurological:     Mental Status: She is alert and oriented to person, place, and time.  Psychiatric:        Mood and Affect: Mood normal.       Most recent functional status assessment:    09/23/2023    8:11 AM  In  your present state of health, do you have any difficulty performing the following activities:  Hearing? 0  Vision? 1  Difficulty concentrating or making decisions? 0  Walking or climbing stairs? 0  Dressing or bathing? 0  Doing errands, shopping? 0  Preparing Food and eating ? N  Using the Toilet? N  In the past six months, have you accidently leaked urine? N  Do you have problems with loss of bowel control? N  Managing your Medications? N  Managing your Finances? N  Housekeeping or managing your Housekeeping? N   Most recent fall risk assessment:    08/20/2022    8:44 AM  Fall Risk   Falls in the past year? 0    Most recent depression screenings:    09/23/2023    8:14 AM 08/20/2022    8:29 AM  PHQ 2/9 Scores  PHQ - 2 Score 0 0  PHQ- 9 Score 0    Most recent cognitive screening:    09/23/2023    8:13 AM  6CIT Screen  What Year? 0 points  What month? 0 points  What time? 0 points  Count back from 20 0 points  Months in reverse 0 points  Repeat phrase 0 points  Total Score 0 points   Most recent Audit-C alcohol use screening    09/23/2023    8:09 AM  Alcohol Use Disorder Test (AUDIT)  1. How often do you have a drink containing alcohol? 0  2. How many drinks containing alcohol do you have on a typical day when you are drinking? 0  3. How often do you have six or more drinks on one occasion? 0  AUDIT-C Score 0   A score of 3 or more in women, and 4 or more in men indicates increased risk for alcohol abuse, EXCEPT if all of the points are from question 1   Vision/Hearing Screen: No results found.    No results found for any visits on 09/23/23.    Assessment & Plan   Annual wellness visit done today including the all of the following: Reviewed patient's Family Medical History Reviewed and updated list of patient's medical providers Assessment of cognitive impairment was done Assessed patient's functional ability Established a written schedule for health  screening services Health Risk Assessent Completed and Reviewed  Exercise Activities and Dietary recommendations  Goals      Have 3 meals a day     Recommend to decrease portion sizes by eating 3 small healthy  meals and at least 2 healthy snacks per day.        Immunization History  Administered Date(s) Administered   Fluad Quad(high Dose 65+) 10/14/2018, 02/13/2021, 02/11/2022   Fluad Trivalent(High Dose 65+) 02/25/2023   Influenza Split 11/30/2005, 10/14/2009, 12/18/2011   Influenza, High Dose Seasonal PF 11/30/2014, 11/29/2015, 11/02/2016, 11/29/2017   Influenza,inj,Quad PF,6+ Mos 11/04/2012, 10/13/2013   PFIZER(Purple Top)SARS-COV-2 Vaccination 03/15/2019, 04/08/2019   Pfizer(Comirnaty)Fall Seasonal Vaccine 12 years and older 02/11/2022   Pneumococcal Conjugate-13 01/05/2014   Pneumococcal Polysaccharide-23 12/01/2004, 11/29/2015   Td 12/26/1995, 12/18/2011   Tdap 12/18/2011    Health Maintenance  Topic Date Due   Zoster Vaccines- Shingrix (1 of 2) Never done   Colonoscopy  Never done   MAMMOGRAM  03/06/2019   DTaP/Tdap/Td (4 - Td or Tdap) 12/17/2021   COVID-19 Vaccine (4 - 2024-25 season) 10/06/2022   COLON CANCER SCREENING ANNUAL FOBT  03/06/2023   HEMOGLOBIN A1C  08/25/2023   INFLUENZA VACCINE  05/04/2024 (Originally 09/05/2023)   Diabetic kidney evaluation - eGFR measurement  02/25/2024   Diabetic kidney evaluation - Urine ACR  02/25/2024   FOOT EXAM  02/25/2024   OPHTHALMOLOGY EXAM  03/10/2024   Medicare Annual Wellness (AWV)  09/22/2024   Pneumococcal Vaccine: 50+ Years  Completed   DEXA SCAN  Completed   Hepatitis C Screening  Completed   HPV VACCINES  Aged Out   Meningococcal B Vaccine  Aged Out     Discussed health benefits of physical activity, and encouraged her to engage in regular exercise appropriate for her age and condition.    Problem List Items Addressed This Visit     Diabetes mellitus, type 2 (HCC)   Currently well controlled on medication  regimen. Denies any symptoms or adverse medication side effects. Last A1C was stable at 6.8. - Continue Metformin  1000 mg twice daily - Order A1C       Relevant Orders   HgB A1c   Hypertension associated with diabetes (HCC)   Currently well controlled on medication regimen. Denies any symptoms or adverse medication side effects. In office BP 133/61 with similar home measurements. Last CMP was slightly elevated but stable. - Continue amlodipine  10 mg - Continue lisinopril -hydrochlorothiazide  20-12.5 daily - Order CMP       Relevant Orders   Comprehensive Metabolic Panel (CMET)   Hyperlipidemia associated with type 2 diabetes mellitus (HCC)   Currently well controlled on medication regimen. Denies any symptoms or adverse medication side effects. Last FLP was stable with slight triglyceride elevation. - Continue atorvastatin  20 mg daily - Order FLP       Relevant Orders   Comprehensive Metabolic Panel (CMET)   Lipid Profile   Diabetic neuropathy (HCC)   History of decreased sensation in the feet. Annual foot exam performed in 01/25. Reports being able to ambulate reasonably well. - Continue current management      Obesity   Currently managed with lifestyle modifications. Weight has remained stable between visits, around 175 lbs. - Continue lifestyle modifications - Discussed importance of healthy weight management      Stage 3a chronic kidney disease (HCC)   Chronic condition due to underlying acquired solitary kidney. Last CMP was abnormal but stable within patient's baseline. - Order CMP - Continue to monitor      Solitary kidney, acquired   Other Visit Diagnoses       Encounter for annual wellness visit (AWV) in Medicare patient    -  Primary     Encounter  for annual physical exam       Relevant Orders   HgB A1c   Comprehensive Metabolic Panel (CMET)   Lipid Profile      General Health Maintenance Discussed wellness and preventative health screenings to  achieve health maintenance goals. - Encouraged flu and Covid vaccinations in the fall - Discussed getting Shingles and TDAP vaccinations at local pharmacy - Declined mammogram and colonoscopy screening - Recent eye exam earlier this year and another one scheduled for later this year  - Up to date on other wellness measures and preventative screenings   Return in about 6 months (around 03/25/2024) for chronic disease f/u.     Elia LULLA Blanch, Medical Student    Patient seen along with MS3 student, Elia Blanch. I personally evaluated this patient along with the student, and verified all aspects of the history, physical exam, and medical decision making as documented by the student. I agree with the student's documentation and have made all necessary edits.  Karen Olsen, Karen HERO, MD, MPH [facility]

## 2023-09-23 NOTE — Assessment & Plan Note (Signed)
 History of decreased sensation in the feet. Annual foot exam performed in 01/25. Reports being able to ambulate reasonably well. - Continue current management

## 2023-09-23 NOTE — Assessment & Plan Note (Signed)
 Currently well controlled on medication regimen. Denies any symptoms or adverse medication side effects. Last FLP was stable with slight triglyceride elevation. - Continue atorvastatin  20 mg daily - Order FLP

## 2023-09-24 LAB — COMPREHENSIVE METABOLIC PANEL WITH GFR
ALT: 15 IU/L (ref 0–32)
AST: 17 IU/L (ref 0–40)
Albumin: 4.5 g/dL (ref 3.8–4.8)
Alkaline Phosphatase: 55 IU/L (ref 44–121)
BUN/Creatinine Ratio: 22 (ref 12–28)
BUN: 27 mg/dL (ref 8–27)
Bilirubin Total: 0.3 mg/dL (ref 0.0–1.2)
CO2: 23 mmol/L (ref 20–29)
Calcium: 9.8 mg/dL (ref 8.7–10.3)
Chloride: 100 mmol/L (ref 96–106)
Creatinine, Ser: 1.25 mg/dL — ABNORMAL HIGH (ref 0.57–1.00)
Globulin, Total: 2.2 g/dL (ref 1.5–4.5)
Glucose: 135 mg/dL — ABNORMAL HIGH (ref 70–99)
Potassium: 4.1 mmol/L (ref 3.5–5.2)
Sodium: 142 mmol/L (ref 134–144)
Total Protein: 6.7 g/dL (ref 6.0–8.5)
eGFR: 45 mL/min/1.73 — ABNORMAL LOW (ref >59)

## 2023-09-24 LAB — HEMOGLOBIN A1C
Est. average glucose Bld gHb Est-mCnc: 146 mg/dL
Hgb A1c MFr Bld: 6.7 % — ABNORMAL HIGH (ref 4.8–5.6)

## 2023-09-24 LAB — LIPID PANEL
Chol/HDL Ratio: 2.7 ratio (ref 0.0–4.4)
Cholesterol, Total: 150 mg/dL (ref 100–199)
HDL: 55 mg/dL (ref >39)
LDL Chol Calc (NIH): 79 mg/dL (ref 0–99)
Triglycerides: 82 mg/dL (ref 0–149)
VLDL Cholesterol Cal: 16 mg/dL (ref 5–40)

## 2023-09-25 ENCOUNTER — Ambulatory Visit: Payer: Self-pay | Admitting: Family Medicine

## 2023-09-30 DIAGNOSIS — H04123 Dry eye syndrome of bilateral lacrimal glands: Secondary | ICD-10-CM | POA: Diagnosis not present

## 2023-09-30 DIAGNOSIS — H3552 Pigmentary retinal dystrophy: Secondary | ICD-10-CM | POA: Diagnosis not present

## 2023-09-30 DIAGNOSIS — E119 Type 2 diabetes mellitus without complications: Secondary | ICD-10-CM | POA: Diagnosis not present

## 2023-09-30 DIAGNOSIS — H2513 Age-related nuclear cataract, bilateral: Secondary | ICD-10-CM | POA: Diagnosis not present

## 2023-09-30 LAB — HM DIABETES EYE EXAM

## 2023-10-14 DIAGNOSIS — H2511 Age-related nuclear cataract, right eye: Secondary | ICD-10-CM | POA: Diagnosis not present

## 2023-10-27 ENCOUNTER — Other Ambulatory Visit: Payer: Self-pay | Admitting: Family Medicine

## 2023-10-29 ENCOUNTER — Encounter: Payer: Self-pay | Admitting: Ophthalmology

## 2023-11-03 NOTE — Discharge Instructions (Signed)

## 2023-11-04 ENCOUNTER — Encounter: Payer: Self-pay | Admitting: Ophthalmology

## 2023-11-04 NOTE — Anesthesia Preprocedure Evaluation (Signed)
 Anesthesia Evaluation  Patient identified by MRN, date of birth, ID band Patient awake    Reviewed: Allergy & Precautions, H&P , NPO status , Patient's Chart, lab work & pertinent test results  Airway Mallampati: IV  TM Distance: <3 FB Neck ROM: Full    Dental no notable dental hx.    Pulmonary neg pulmonary ROS   Pulmonary exam normal breath sounds clear to auscultation       Cardiovascular hypertension, negative cardio ROS Normal cardiovascular exam Rhythm:Regular Rate:Normal     Neuro/Psych  Headaches  Neuromuscular disease negative neurological ROS  negative psych ROS   GI/Hepatic negative GI ROS, Neg liver ROS,,,  Endo/Other  negative endocrine ROSdiabetes    Renal/GU Renal diseasenegative Renal ROS  negative genitourinary   Musculoskeletal negative musculoskeletal ROS (+)    Abdominal   Peds negative pediatric ROS (+)  Hematology negative hematology ROS (+)   Anesthesia Other Findings Diabetes mellitus with diabetic polyneuropathy HTN   Reproductive/Obstetrics negative OB ROS                              Anesthesia Physical Anesthesia Plan  ASA: 3  Anesthesia Plan: MAC   Post-op Pain Management:    Induction: Intravenous  PONV Risk Score and Plan:   Airway Management Planned: Natural Airway and Nasal Cannula  Additional Equipment:   Intra-op Plan:   Post-operative Plan:   Informed Consent: I have reviewed the patients History and Physical, chart, labs and discussed the procedure including the risks, benefits and alternatives for the proposed anesthesia with the patient or authorized representative who has indicated his/her understanding and acceptance.     Dental Advisory Given  Plan Discussed with: Anesthesiologist, CRNA and Surgeon  Anesthesia Plan Comments: (Patient consented for risks of anesthesia including but not limited to:  - adverse reactions to  medications - damage to eyes, teeth, lips or other oral mucosa - nerve damage due to positioning  - sore throat or hoarseness - Damage to heart, brain, nerves, lungs, other parts of body or loss of life  Patient voiced understanding and assent.)        Anesthesia Quick Evaluation

## 2023-11-05 ENCOUNTER — Encounter: Payer: Self-pay | Admitting: Ophthalmology

## 2023-11-05 ENCOUNTER — Ambulatory Visit: Payer: Self-pay | Admitting: Anesthesiology

## 2023-11-05 ENCOUNTER — Encounter
Admission: RE | Disposition: A | Discharge status: Home / Self Care | Payer: Self-pay | Source: Home / Self Care | Attending: Ophthalmology

## 2023-11-05 ENCOUNTER — Other Ambulatory Visit: Payer: Self-pay

## 2023-11-05 ENCOUNTER — Ambulatory Visit
Admission: RE | Admit: 2023-11-05 | Discharge: 2023-11-05 | Disposition: A | Discharge status: Home / Self Care | Attending: Ophthalmology | Admitting: Ophthalmology

## 2023-11-05 DIAGNOSIS — I10 Essential (primary) hypertension: Secondary | ICD-10-CM | POA: Diagnosis not present

## 2023-11-05 DIAGNOSIS — I129 Hypertensive chronic kidney disease with stage 1 through stage 4 chronic kidney disease, or unspecified chronic kidney disease: Secondary | ICD-10-CM | POA: Diagnosis not present

## 2023-11-05 DIAGNOSIS — Z7984 Long term (current) use of oral hypoglycemic drugs: Secondary | ICD-10-CM | POA: Insufficient documentation

## 2023-11-05 DIAGNOSIS — E1136 Type 2 diabetes mellitus with diabetic cataract: Secondary | ICD-10-CM | POA: Insufficient documentation

## 2023-11-05 DIAGNOSIS — H2511 Age-related nuclear cataract, right eye: Secondary | ICD-10-CM | POA: Insufficient documentation

## 2023-11-05 DIAGNOSIS — N1831 Chronic kidney disease, stage 3a: Secondary | ICD-10-CM | POA: Diagnosis not present

## 2023-11-05 HISTORY — PX: CATARACT EXTRACTION W/PHACO: SHX586

## 2023-11-05 HISTORY — DX: Type 2 diabetes mellitus with diabetic polyneuropathy: E11.42

## 2023-11-05 LAB — GLUCOSE, CAPILLARY: Glucose-Capillary: 130 mg/dL — ABNORMAL HIGH (ref 70–99)

## 2023-11-05 SURGERY — PHACOEMULSIFICATION, CATARACT, WITH IOL INSERTION
Anesthesia: Monitor Anesthesia Care | Site: Eye | Laterality: Right

## 2023-11-05 MED ORDER — LACTATED RINGERS IV SOLN: INTRAVENOUS | Status: DC

## 2023-11-05 MED ORDER — MOXIFLOXACIN HCL 0.5 % OP SOLN
OPHTHALMIC | Status: DC | PRN
  Administered 2023-11-05: .2 mL via OPHTHALMIC

## 2023-11-05 MED ORDER — BRIMONIDINE TARTRATE-TIMOLOL 0.2-0.5 % OP SOLN
OPHTHALMIC | Status: DC | PRN
  Administered 2023-11-05: 1 [drp] via OPHTHALMIC

## 2023-11-05 MED ORDER — LIDOCAINE HCL (PF) 2 % IJ SOLN
INTRAOCULAR | Status: DC | PRN
  Administered 2023-11-05: 2 mL

## 2023-11-05 MED ORDER — SIGHTPATH DOSE#1 BSS IO SOLN
INTRAOCULAR | Status: DC | PRN
  Administered 2023-11-05: 15 mL via INTRAOCULAR

## 2023-11-05 MED ORDER — TETRACAINE HCL 0.5 % OP SOLN
OPHTHALMIC | Status: AC
  Filled 2023-11-05: qty 4

## 2023-11-05 MED ORDER — ARMC OPHTHALMIC DILATING DROPS
1.0000 | OPHTHALMIC | Status: DC | PRN
Start: 2023-11-05 — End: 2023-11-05
  Administered 2023-11-05 (×3): 1 via OPHTHALMIC

## 2023-11-05 MED ORDER — FENTANYL CITRATE (PF) 100 MCG/2ML IJ SOLN
INTRAMUSCULAR | Status: DC | PRN
  Administered 2023-11-05: 50 ug via INTRAVENOUS

## 2023-11-05 MED ORDER — SIGHTPATH DOSE#1 NA HYALUR & NA CHOND-NA HYALUR IO KIT
PACK | INTRAOCULAR | Status: DC | PRN
  Administered 2023-11-05: 1 via OPHTHALMIC

## 2023-11-05 MED ORDER — MIDAZOLAM HCL 2 MG/2ML IJ SOLN
INTRAMUSCULAR | Status: DC | PRN
  Administered 2023-11-05: 1 mg via INTRAVENOUS

## 2023-11-05 MED ORDER — TETRACAINE HCL 0.5 % OP SOLN
1.0000 [drp] | OPHTHALMIC | Status: DC | PRN
Start: 2023-11-05 — End: 2023-11-05
  Administered 2023-11-05 (×3): 1 [drp] via OPHTHALMIC

## 2023-11-05 MED ORDER — FENTANYL CITRATE (PF) 100 MCG/2ML IJ SOLN
INTRAMUSCULAR | Status: AC
  Filled 2023-11-05: qty 2

## 2023-11-05 MED ORDER — SIGHTPATH DOSE#1 BSS IO SOLN
INTRAOCULAR | Status: DC | PRN
  Administered 2023-11-05: 81 mL via OPHTHALMIC

## 2023-11-05 MED ORDER — ARMC OPHTHALMIC DILATING DROPS
OPHTHALMIC | Status: AC
  Filled 2023-11-05: qty 0.5

## 2023-11-05 MED ORDER — MIDAZOLAM HCL 2 MG/2ML IJ SOLN
INTRAMUSCULAR | Status: AC
  Filled 2023-11-05: qty 2

## 2023-11-05 SURGICAL SUPPLY — 8 items
FEE CATARACT SUITE SIGHTPATH (MISCELLANEOUS) ×2 IMPLANT
GLOVE BIOGEL PI IND STRL 8 (GLOVE) ×2 IMPLANT
GLOVE SURG LX STRL 7.5 STRW (GLOVE) ×2 IMPLANT
GLOVE SURG SYN 6.5 PF PI BL (GLOVE) ×2 IMPLANT
LENS IOL TECNIS EYHANCE 24.5 (Intraocular Lens) IMPLANT
NDL FILTER BLUNT 18X1 1/2 (NEEDLE) ×2 IMPLANT
NEEDLE FILTER BLUNT 18X1 1/2 (NEEDLE) ×1 IMPLANT
SYR 3ML LL SCALE MARK (SYRINGE) ×2 IMPLANT

## 2023-11-05 NOTE — Op Note (Signed)
 LOCATION:  [facility]   PREOPERATIVE DIAGNOSIS:    Nuclear sclerotic cataract right eye. H25.11   POSTOPERATIVE DIAGNOSIS:  Nuclear sclerotic cataract right eye.     PROCEDURE:  Phacoemusification with posterior chamber intraocular lens placement of the right eye   ULTRASOUND TIME: Procedure(s): PHACOEMULSIFICATION, CATARACT, WITH IOL INSERTION 13.39 00:58.4 (Right)  LENS:   Implant Name Type Inv. Item Serial No. Manufacturer Lot No. LRB No. Used Action  LENS IOL TECNIS EYHANCE 24.5 - [id number] Intraocular Lens LENS IOL TECNIS EYHANCE 24.5 [id number] [employer]  Right 1 Implanted         SURGEON:  Dene FABIENE Etienne, MD   ANESTHESIA:  Topical with tetracaine drops and 2% Xylocaine jelly, augmented with 1% preservative-free intracameral lidocaine.    COMPLICATIONS:  None.   DESCRIPTION OF PROCEDURE:  The patient was identified in the holding room and transported to the operating room and placed in the supine position under the operating microscope.  The right eye was identified as the operative eye and it was prepped and draped in the usual sterile ophthalmic fashion.   A 1 millimeter clear-corneal paracentesis was made at the 12:00 position.  0.5 ml of preservative-free 1% lidocaine was injected into the anterior chamber. The anterior chamber was filled with Viscoat viscoelastic.  A 2.4 millimeter keratome was used to make a near-clear corneal incision at the 9:00 position.  A curvilinear capsulorrhexis was made with a cystotome and capsulorrhexis forceps.  Balanced salt solution was used to hydrodissect and hydrodelineate the nucleus.   Phacoemulsification was then used in stop and chop fashion to remove the lens nucleus and epinucleus.  The remaining cortex was then removed using the irrigation and aspiration handpiece. Provisc was then placed into the capsular bag to distend it for lens placement.  A lens was then injected into the capsular bag.  The remaining  viscoelastic was aspirated.   Wounds were hydrated with balanced salt solution.  The anterior chamber was inflated to a physiologic pressure with balanced salt solution.  No wound leaks were noted. Vigamox 0.2 ml of a 1mg  per ml solution was injected into the anterior chamber for a dose of 0.2 mg of intracameral antibiotic at the completion of the case.   Timolol and Brimonidine drops were applied to the eye.  The patient was taken to the recovery room in stable condition without complications of anesthesia or surgery.   Saban Heinlen 11/05/2023, 9:56 AM

## 2023-11-05 NOTE — Transfer of Care (Signed)
 Immediate Anesthesia Transfer of Care Note  [name]  Procedure(s) Performed: PHACOEMULSIFICATION, CATARACT, WITH IOL INSERTION 13.39 00:58.4 (Right: Eye)  Patient Location: PACU  Anesthesia Type: MAC  Level of Consciousness: awake, alert  and patient cooperative  Airway and Oxygen Therapy: Patient Spontanous Breathing   Post-op Assessment: Post-op Vital signs reviewed, Patient's Cardiovascular Status Stable, Respiratory Function Stable, Patent Airway and No signs of Nausea or vomiting  Post-op Vital Signs: Reviewed and stable  Complications: No notable events documented.

## 2023-11-05 NOTE — H&P (Signed)
 [facility]   Primary Care Physician:  Myrla Jon HERO, MD Ophthalmologist: Dr. Dene Etienne  Pre-Procedure History & Physical: HPI:  Karen Olsen is a 73 y.o. female here for ophthalmic surgery.   Past Medical History:  Diagnosis Date   Diabetes mellitus with diabetic polyneuropathy (HCC)    Diabetes mellitus without complication (HCC)    Hyperlipidemia    Hypertension     Past Surgical History:  Procedure Laterality Date   HERNIA REPAIR  01/2013   Dr. Wonda   LIPOMA EXCISION     Removed from back.   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 Authorizing Provider  acetaminophen (TYLENOL) 500 MG tablet Take 500 mg by mouth every 6 (six) hours as needed.   Yes [provider]  amLODipine  (NORVASC ) 10 MG tablet TAKE 1 TABLET BY MOUTH DAILY 02/25/23  Yes Bacigalupo, Angela M, MD  atorvastatin  (LIPITOR) 20 MG tablet TAKE 1 TABLET BY MOUTH DAILY 10/28/23  Yes Bacigalupo, Angela M, MD  Coenzyme Q10 (COQ10) 200 MG CAPS Take 1 capsule by mouth daily. Patient taking differently: Take 100 mg by mouth daily. 08/23/15  Yes Vivienne Delon HERO, PA-C  glucose blood test strip RELION CONFIRM/MICRO TEST (In Vitro Strip)  1 (one) Strip Strip daily for 0 days  Quantity: 100;  Refills: 3   Ordered :29-July-2012  Ruther Setter ;  Started 29-July-2012 Active 07/29/12  Yes [provider]  lisinopril -hydrochlorothiazide  (ZESTORETIC ) 20-12.5 MG tablet TAKE 2 TABLETS BY MOUTH DAILY. 02/25/23  Yes Bacigalupo, Jon HERO, MD  metFORMIN  (GLUCOPHAGE ) 1000 MG tablet Take 1 tablet (1,000 mg total) by mouth 2 (two) times daily. 02/25/23  Yes Bacigalupo, Angela M, MD  Multiple Vitamins-Minerals (MULTIVITAMIN ADULTS PO) Take by mouth daily.    Yes [provider]  Omega-3 Fatty Acids (FISH OIL)  1000 MG CAPS Take by mouth. Reported on 02/22/2015   Yes [provider]    Allergies as of 10/02/2023 - Review Complete 09/23/2023  Allergen Reaction Noted   Phosphate laxative  [sodium phosphate]  06/21/2014   Ceclor  [cefaclor] Rash 06/21/2014   Penicillins Rash 06/21/2014    Family History  Problem Relation Age of Onset   Stroke Mother    Prostate cancer Father    Congestive Heart Failure Father    Stroke Maternal Grandmother    Stroke Maternal Grandfather    Alzheimer's disease Maternal Grandfather    Cancer Paternal Grandmother    Cancer Paternal Grandfather    Breast cancer Neg Hx     Social History   Socioeconomic History   Marital status: Married    Spouse name: Engineer, materials   Number of children: 1   Years of education: 14   Highest education level: Associate degree: occupational, Scientist, product/process development, or vocational program  Occupational History    Employer: OTHER    Comment: Producer, television/film/video  Tobacco Use   Smoking status: Never   Smokeless tobacco: Never  Vaping Use   Vaping status: Never Used  Substance and Sexual Activity   Alcohol use: Not Currently   Drug use: No   Sexual activity: Not Currently  Other Topics Concern   Not on file  Social History Narrative   Not on file   Social Drivers of Health   Financial  Resource Strain: Low Risk  (09/23/2023)   Overall Financial Resource Strain (CARDIA)    Difficulty of Paying Living Expenses: Not hard at all  Food Insecurity: No Food Insecurity (09/23/2023)   Hunger Vital Sign    Worried About Running Out of Food in the Last Year: Never true    Ran Out of Food in the Last Year: Never true  Transportation Needs: No Transportation Needs (09/23/2023)   PRAPARE - Administrator, Civil Service (Medical): No    Lack of Transportation (Non-Medical): No  Physical Activity: Sufficiently Active (04/24/2020)   Exercise Vital Sign    Days of Exercise per Week: 7 days    Minutes of Exercise per Session: 30 min   Stress: No Stress Concern Present (09/23/2023)   [employer] of Occupational Health - Occupational Stress Questionnaire    Feeling of Stress: Not at all  Social Connections: Moderately Isolated (04/24/2020)   Social Connection and Isolation Panel    Frequency of Communication with Friends and Family: More than three times a week    Frequency of Social Gatherings with Friends and Family: More than three times a week    Attends Religious Services: Never    Database administrator or Organizations: No    Attends Banker Meetings: Never    Marital Status: Married  Catering manager Violence: Not At Risk (09/23/2023)   Humiliation, Afraid, Rape, and Kick questionnaire    Fear of Current or Ex-Partner: No    Emotionally Abused: No    Physically Abused: No    Sexually Abused: No    Review of Systems: See HPI, otherwise negative ROS  Physical Exam: BP (!) 145/76   Pulse 66   Temp 97.6 F (36.4 C) (Temporal)   Resp 14   Ht 5' 2 (1.575 m)   Wt 78 kg   SpO2 100%   BMI 31.46 kg/m  General:   Alert,  pleasant and cooperative in NAD Head:  Normocephalic and atraumatic. Lungs:  Clear to auscultation.    Heart:  Regular rate and rhythm.   Impression/Plan: Karen Olsen is here for ophthalmic surgery.  Risks, benefits, limitations, and alternatives regarding ophthalmic surgery have been reviewed with the patient.  Questions have been answered.  All parties agreeable.   Karen GASKIN, MD  11/05/2023, 8:52 AM

## 2023-11-05 NOTE — Anesthesia Postprocedure Evaluation (Signed)
 Anesthesia Post Note  Patient: Karen  W Olsen  Procedure(s) Performed: PHACOEMULSIFICATION, CATARACT, WITH IOL INSERTION 13.39 00:58.4 (Right: Eye)  Patient location during evaluation: PACU Anesthesia Type: MAC Level of consciousness: awake and alert Pain management: pain level controlled Vital Signs Assessment: post-procedure vital signs reviewed and stable Respiratory status: spontaneous breathing, nonlabored ventilation, respiratory function stable and patient connected to nasal cannula oxygen Cardiovascular status: stable and blood pressure returned to baseline Postop Assessment: no apparent nausea or vomiting Anesthetic complications: no   No notable events documented.   Last Vitals:  Vitals:   11/05/23 1000 11/05/23 1001  BP: 118/65 116/64  Pulse: 68 (!) 58  Resp: 17 12  Temp:  36.5 C  SpO2: 100% 99%    Last Pain:  Vitals:   11/05/23 1001  TempSrc:   PainSc: 0-No pain                 Dachelle Molzahn C Kinzy Weyers

## 2023-11-06 DIAGNOSIS — H2512 Age-related nuclear cataract, left eye: Secondary | ICD-10-CM | POA: Diagnosis not present

## 2023-11-10 NOTE — Anesthesia Preprocedure Evaluation (Addendum)
 Anesthesia Evaluation  Patient identified by MRN, date of birth, ID band Patient awake    Reviewed: Allergy & Precautions, H&P , NPO status , Patient's Chart, lab work & pertinent test results  Airway Mallampati: IV  TM Distance: <3 FB Neck ROM: Full    Dental no notable dental hx.    Pulmonary neg pulmonary ROS   Pulmonary exam normal breath sounds clear to auscultation       Cardiovascular hypertension, negative cardio ROS Normal cardiovascular exam Rhythm:Regular Rate:Normal     Neuro/Psych  Headaches  Neuromuscular disease negative neurological ROS  negative psych ROS   GI/Hepatic negative GI ROS, Neg liver ROS,,,  Endo/Other  negative endocrine ROSdiabetes    Renal/GU Renal diseasenegative Renal ROS  negative genitourinary   Musculoskeletal negative musculoskeletal ROS (+)    Abdominal   Peds negative pediatric ROS (+)  Hematology negative hematology ROS (+)   Anesthesia Other Findings Previous cataract 11-05-23 Dr. Ola  Diabetes mellitus with diabetic polyneuropathy HTN     Reproductive/Obstetrics negative OB ROS                              Anesthesia Physical Anesthesia Plan  ASA: 3  Anesthesia Plan: MAC   Post-op Pain Management:    Induction: Intravenous  PONV Risk Score and Plan:   Airway Management Planned: Natural Airway and Nasal Cannula  Additional Equipment:   Intra-op Plan:   Post-operative Plan:   Informed Consent: I have reviewed the patients History and Physical, chart, labs and discussed the procedure including the risks, benefits and alternatives for the proposed anesthesia with the patient or authorized representative who has indicated his/her understanding and acceptance.     Dental Advisory Given  Plan Discussed with: Anesthesiologist, CRNA and Surgeon  Anesthesia Plan Comments: (Patient consented for risks of anesthesia including but  not limited to:  - adverse reactions to medications - damage to eyes, teeth, lips or other oral mucosa - nerve damage due to positioning  - sore throat or hoarseness - Damage to heart, brain, nerves, lungs, other parts of body or loss of life  Patient voiced understanding and assent.)         Anesthesia Quick Evaluation

## 2023-11-17 NOTE — Discharge Instructions (Signed)

## 2023-11-19 ENCOUNTER — Ambulatory Visit: Payer: Self-pay | Admitting: Anesthesiology

## 2023-11-19 ENCOUNTER — Ambulatory Visit
Admission: RE | Admit: 2023-11-19 | Discharge: 2023-11-19 | Disposition: A | Discharge status: Home / Self Care | Attending: Ophthalmology | Admitting: Ophthalmology

## 2023-11-19 ENCOUNTER — Encounter
Admission: RE | Disposition: A | Discharge status: Home / Self Care | Payer: Self-pay | Source: Home / Self Care | Attending: Ophthalmology

## 2023-11-19 ENCOUNTER — Other Ambulatory Visit: Payer: Self-pay

## 2023-11-19 ENCOUNTER — Encounter: Payer: Self-pay | Admitting: Ophthalmology

## 2023-11-19 DIAGNOSIS — E1136 Type 2 diabetes mellitus with diabetic cataract: Secondary | ICD-10-CM | POA: Diagnosis not present

## 2023-11-19 DIAGNOSIS — R519 Headache, unspecified: Secondary | ICD-10-CM | POA: Diagnosis not present

## 2023-11-19 DIAGNOSIS — H2512 Age-related nuclear cataract, left eye: Secondary | ICD-10-CM | POA: Diagnosis not present

## 2023-11-19 DIAGNOSIS — Z961 Presence of intraocular lens: Secondary | ICD-10-CM | POA: Insufficient documentation

## 2023-11-19 DIAGNOSIS — Z79899 Other long term (current) drug therapy: Secondary | ICD-10-CM | POA: Diagnosis not present

## 2023-11-19 DIAGNOSIS — Z7984 Long term (current) use of oral hypoglycemic drugs: Secondary | ICD-10-CM | POA: Insufficient documentation

## 2023-11-19 DIAGNOSIS — E1122 Type 2 diabetes mellitus with diabetic chronic kidney disease: Secondary | ICD-10-CM | POA: Diagnosis not present

## 2023-11-19 DIAGNOSIS — I10 Essential (primary) hypertension: Secondary | ICD-10-CM | POA: Insufficient documentation

## 2023-11-19 DIAGNOSIS — E785 Hyperlipidemia, unspecified: Secondary | ICD-10-CM | POA: Insufficient documentation

## 2023-11-19 DIAGNOSIS — N1831 Chronic kidney disease, stage 3a: Secondary | ICD-10-CM | POA: Diagnosis not present

## 2023-11-19 DIAGNOSIS — Z9841 Cataract extraction status, right eye: Secondary | ICD-10-CM | POA: Insufficient documentation

## 2023-11-19 DIAGNOSIS — I129 Hypertensive chronic kidney disease with stage 1 through stage 4 chronic kidney disease, or unspecified chronic kidney disease: Secondary | ICD-10-CM | POA: Diagnosis not present

## 2023-11-19 HISTORY — PX: CATARACT EXTRACTION W/PHACO: SHX586

## 2023-11-19 LAB — GLUCOSE, CAPILLARY: Glucose-Capillary: 118 mg/dL — ABNORMAL HIGH (ref 70–99)

## 2023-11-19 SURGERY — PHACOEMULSIFICATION, CATARACT, WITH IOL INSERTION
Anesthesia: Monitor Anesthesia Care | Site: Eye | Laterality: Left

## 2023-11-19 MED ORDER — MOXIFLOXACIN HCL 0.5 % OP SOLN
OPHTHALMIC | Status: DC | PRN
Start: 2023-11-19 — End: 2023-11-19
  Administered 2023-11-19: .2 mL via OPHTHALMIC

## 2023-11-19 MED ORDER — TETRACAINE HCL 0.5 % OP SOLN
1.0000 [drp] | OPHTHALMIC | Status: DC | PRN
Start: 2023-11-19 — End: 2023-11-19
  Administered 2023-11-19 (×3): 1 [drp] via OPHTHALMIC

## 2023-11-19 MED ORDER — ARMC OPHTHALMIC DILATING DROPS
1.0000 | OPHTHALMIC | Status: DC | PRN
Start: 2023-11-19 — End: 2023-11-19
  Administered 2023-11-19 (×3): 1 via OPHTHALMIC

## 2023-11-19 MED ORDER — TETRACAINE HCL 0.5 % OP SOLN
OPHTHALMIC | Status: AC
  Filled 2023-11-19: qty 4

## 2023-11-19 MED ORDER — LACTATED RINGERS IV SOLN: INTRAVENOUS | Status: DC

## 2023-11-19 MED ORDER — LIDOCAINE HCL (PF) 2 % IJ SOLN
INTRAOCULAR | Status: DC | PRN
  Administered 2023-11-19: 2 mL

## 2023-11-19 MED ORDER — SIGHTPATH DOSE#1 BSS IO SOLN
INTRAOCULAR | Status: DC | PRN
  Administered 2023-11-19: 15 mL via INTRAOCULAR

## 2023-11-19 MED ORDER — FENTANYL CITRATE (PF) 100 MCG/2ML IJ SOLN
INTRAMUSCULAR | Status: DC | PRN
  Administered 2023-11-19: 50 ug via INTRAVENOUS

## 2023-11-19 MED ORDER — BRIMONIDINE TARTRATE-TIMOLOL 0.2-0.5 % OP SOLN
OPHTHALMIC | Status: DC | PRN
  Administered 2023-11-19: 1 [drp] via OPHTHALMIC

## 2023-11-19 MED ORDER — ARMC OPHTHALMIC DILATING DROPS
OPHTHALMIC | Status: AC
  Filled 2023-11-19: qty 0.5

## 2023-11-19 MED ORDER — SIGHTPATH DOSE#1 NA HYALUR & NA CHOND-NA HYALUR IO KIT
PACK | INTRAOCULAR | Status: DC | PRN
  Administered 2023-11-19: 1 via OPHTHALMIC

## 2023-11-19 MED ORDER — MIDAZOLAM HCL 2 MG/2ML IJ SOLN
INTRAMUSCULAR | Status: AC
  Filled 2023-11-19: qty 2

## 2023-11-19 MED ORDER — FENTANYL CITRATE (PF) 100 MCG/2ML IJ SOLN
INTRAMUSCULAR | Status: AC
  Filled 2023-11-19: qty 2

## 2023-11-19 MED ORDER — SIGHTPATH DOSE#1 BSS IO SOLN
INTRAOCULAR | Status: DC | PRN
  Administered 2023-11-19: 103 mL via OPHTHALMIC

## 2023-11-19 MED ORDER — MIDAZOLAM HCL 2 MG/2ML IJ SOLN
INTRAMUSCULAR | Status: DC | PRN
  Administered 2023-11-19: 2 mg via INTRAVENOUS

## 2023-11-19 SURGICAL SUPPLY — 8 items
FEE CATARACT SUITE SIGHTPATH (MISCELLANEOUS) ×2 IMPLANT
GLOVE BIOGEL PI IND STRL 8 (GLOVE) ×2 IMPLANT
GLOVE SURG LX STRL 7.5 STRW (GLOVE) ×2 IMPLANT
GLOVE SURG SYN 6.5 PF PI BL (GLOVE) ×2 IMPLANT
LENS IOL TECNIS EYHANCE 24.0 (Intraocular Lens) IMPLANT
NDL FILTER BLUNT 18X1 1/2 (NEEDLE) ×2 IMPLANT
NEEDLE FILTER BLUNT 18X1 1/2 (NEEDLE) ×1 IMPLANT
SYR 3ML LL SCALE MARK (SYRINGE) ×2 IMPLANT

## 2023-11-19 NOTE — H&P (Signed)
 [facility]   Primary Care Physician:  Myrla Jon HERO, MD Ophthalmologist: Dr. Dene Etienne  Pre-Procedure History & Physical: HPI:  Karen Olsen is a 73 y.o. female here for ophthalmic surgery.   Past Medical History:  Diagnosis Date   Diabetes mellitus with diabetic polyneuropathy (HCC)    Diabetes mellitus without complication (HCC)    Hyperlipidemia    Hypertension     Past Surgical History:  Procedure Laterality Date   CATARACT EXTRACTION W/PHACO Right 11/05/2023   Procedure: PHACOEMULSIFICATION, CATARACT, WITH IOL INSERTION 13.39 00:58.4;  Surgeon: Etienne Dene, MD;  Location: [facility];  Service: Ophthalmology;  Laterality: Right;   HERNIA REPAIR  01/2013   Dr. Wonda   LIPOMA EXCISION     Removed from back.   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 Authorizing Provider  amLODipine  (NORVASC ) 10 MG tablet TAKE 1 TABLET BY MOUTH DAILY 02/25/23  Yes Bacigalupo, Angela M, MD  atorvastatin  (LIPITOR) 20 MG tablet TAKE 1 TABLET BY MOUTH DAILY 10/28/23  Yes Bacigalupo, Angela M, MD  lisinopril -hydrochlorothiazide  (ZESTORETIC ) 20-12.5 MG tablet TAKE 2 TABLETS BY MOUTH DAILY. 02/25/23  Yes Bacigalupo, Jon HERO, MD  metFORMIN  (GLUCOPHAGE ) 1000 MG tablet Take 1 tablet (1,000 mg total) by mouth 2 (two) times daily. 02/25/23  Yes Bacigalupo, Angela M, MD  Multiple Vitamins-Minerals (MULTIVITAMIN ADULTS PO) Take by mouth daily.    Yes [provider]  Omega-3 Fatty Acids (FISH OIL) 1000 MG CAPS Take by mouth. Reported on 02/22/2015   Yes [provider]  acetaminophen (TYLENOL) 500 MG tablet Take 500 mg by mouth every 6 (six) hours as needed.    [provider]  Coenzyme Q10 (COQ10) 200 MG CAPS Take 1 capsule by mouth daily. Patient taking  differently: Take 100 mg by mouth daily. 08/23/15   Vivienne Delon HERO, PA-C  glucose blood test strip RELION CONFIRM/MICRO TEST (In Vitro Strip)  1 (one) Strip Strip daily for 0 days  Quantity: 100;  Refills: 3   Ordered :29-July-2012  Ruther Setter ;  Started 29-July-2012 Active 07/29/12   [provider]    Allergies as of 10/02/2023 - Review Complete 09/23/2023  Allergen Reaction Noted   Phosphate laxative  [sodium phosphate]  06/21/2014   Ceclor  [cefaclor] Rash 06/21/2014   Penicillins Rash 06/21/2014    Family History  Problem Relation Age of Onset   Stroke Mother    Prostate cancer Father    Congestive Heart Failure Father    Stroke Maternal Grandmother    Stroke Maternal Grandfather    Alzheimer's disease Maternal Grandfather    Cancer Paternal Grandmother    Cancer Paternal Grandfather    Breast cancer Neg Hx     Social History   Socioeconomic History   Marital status: Married    Spouse name: Engineer, materials   Number of children: 1   Years of education: 14   Highest education level: Associate degree: occupational, Scientist, product/process development, or vocational program  Occupational History    Employer: OTHER    Comment: Producer, television/film/video  Tobacco Use   Smoking status: Never   Smokeless tobacco: Never  Vaping Use   Vaping status: Never Used  Substance and Sexual Activity   Alcohol use: Not Currently   Drug use: No  Sexual activity: Not Currently  Other Topics Concern   Not on file  Social History Narrative   Not on file   Social Drivers of Health   Financial Resource Strain: Low Risk  (09/23/2023)   Overall Financial Resource Strain (CARDIA)    Difficulty of Paying Living Expenses: Not hard at all  Food Insecurity: No Food Insecurity (09/23/2023)   Hunger Vital Sign    Worried About Running Out of Food in the Last Year: Never true    Ran Out of Food in the Last Year: Never true  Transportation Needs: No Transportation Needs (09/23/2023)   PRAPARE - Therapist, art (Medical): No    Lack of Transportation (Non-Medical): No  Physical Activity: Sufficiently Active (04/24/2020)   Exercise Vital Sign    Days of Exercise per Week: 7 days    Minutes of Exercise per Session: 30 min  Stress: No Stress Concern Present (09/23/2023)   [employer] of Occupational Health - Occupational Stress Questionnaire    Feeling of Stress: Not at all  Social Connections: Moderately Isolated (04/24/2020)   Social Connection and Isolation Panel    Frequency of Communication with Friends and Family: More than three times a week    Frequency of Social Gatherings with Friends and Family: More than three times a week    Attends Religious Services: Never    Database administrator or Organizations: No    Attends Banker Meetings: Never    Marital Status: Married  Catering manager Violence: Not At Risk (09/23/2023)   Humiliation, Afraid, Rape, and Kick questionnaire    Fear of Current or Ex-Partner: No    Emotionally Abused: No    Physically Abused: No    Sexually Abused: No    Review of Systems: See HPI, otherwise negative ROS  Physical Exam: There were no vitals taken for this visit. General:   Alert,  pleasant and cooperative in NAD Head:  Normocephalic and atraumatic. Lungs:  Clear to auscultation.    Heart:  Regular rate and rhythm.   Impression/Plan: Karen Olsen is here for ophthalmic surgery.  Risks, benefits, limitations, and alternatives regarding ophthalmic surgery have been reviewed with the patient.  Questions have been answered.  All parties agreeable.   MITTIE GASKIN, MD  11/19/2023, 9:03 AM

## 2023-11-19 NOTE — Op Note (Signed)
 OPERATIVE NOTE  Karen  [name] [id number] 11/19/2023   PREOPERATIVE DIAGNOSIS:  Nuclear sclerotic cataract left eye. H25.12   POSTOPERATIVE DIAGNOSIS:    Nuclear sclerotic cataract left eye.     PROCEDURE:  Phacoemusification with posterior chamber intraocular lens placement of the left eye  Ultrasound time: Procedure(s): PHACOEMULSIFICATION, CATARACT, WITH IOL INSERTION 16.23 01:10.6 (Left)  LENS:   Implant Name Type Inv. Item Serial No. Manufacturer Lot No. LRB No. Used Action  LENS IOL TECNIS EYHANCE 24.0 - [id number] Intraocular Lens LENS IOL TECNIS EYHANCE 24.0 [id number] [employer]  Left 1 Implanted      SURGEON:  Dene FABIENE Etienne, MD   ANESTHESIA:  Topical with tetracaine drops and 2% Xylocaine jelly, augmented with 1% preservative-free intracameral lidocaine.    COMPLICATIONS:  None.   DESCRIPTION OF PROCEDURE:  The patient was identified in the holding room and transported to the operating room and placed in the supine position under the operating microscope.  The left eye was identified as the operative eye and it was prepped and draped in the usual sterile ophthalmic fashion.   A 1 millimeter clear-corneal paracentesis was made at the 1:30 position.  0.5 ml of preservative-free 1% lidocaine was injected into the anterior chamber.  The anterior chamber was filled with Viscoat viscoelastic.  A 2.4 millimeter keratome was used to make a near-clear corneal incision at the 10:30 position.  .  A curvilinear capsulorrhexis was made with a cystotome and capsulorrhexis forceps.  Balanced salt solution was used to hydrodissect and hydrodelineate the nucleus.   Phacoemulsification was then used in stop and chop fashion to remove the lens nucleus and epinucleus.  The remaining cortex was then removed using the irrigation and aspiration handpiece. Provisc was then placed into the capsular bag to distend it for lens placement.  A lens was then injected into the capsular bag.  The  remaining viscoelastic was aspirated.   Wounds were hydrated with balanced salt solution.  The anterior chamber was inflated to a physiologic pressure with balanced salt solution.  No wound leaks were noted. Cefuroxime 0.1 ml of a 10mg /ml solution was injected into the anterior chamber for a dose of 1 mg of intracameral antibiotic at the completion of the case.   Timolol and Brimonidine drops were applied to the eye.  The patient was taken to the recovery room in stable condition without complications of anesthesia or surgery.  Calder Oblinger 11/19/2023, 10:09 AM

## 2023-11-19 NOTE — Anesthesia Postprocedure Evaluation (Signed)
 Anesthesia Post Note  [name]  W Moylan  Procedure(s) Performed: PHACOEMULSIFICATION, CATARACT, WITH IOL INSERTION 16.23 01:10.6 (Left: Eye)  Patient location during evaluation: PACU Anesthesia Type: MAC Level of consciousness: awake and alert Pain management: pain level controlled Vital Signs Assessment: post-procedure vital signs reviewed and stable Respiratory status: spontaneous breathing, nonlabored ventilation, respiratory function stable and patient connected to nasal cannula oxygen Cardiovascular status: stable and blood pressure returned to baseline Postop Assessment: no apparent nausea or vomiting Anesthetic complications: no   No notable events documented.   Last Vitals:  Vitals:   11/19/23 1015 11/19/23 1017  BP: 111/69 123/72  Pulse: 61 (!) 58  Resp: 10 13  Temp:  (!) 36.1 C  SpO2: 98% 100%    Last Pain:  Vitals:   11/19/23 1017  TempSrc:   PainSc: 0-No pain                 Rossanna Spitzley C Lorry Anastasi

## 2023-11-19 NOTE — Transfer of Care (Signed)
 Immediate Anesthesia Transfer of Care Note  Patient: Jackelin  W Holford  Procedure(s) Performed: PHACOEMULSIFICATION, CATARACT, WITH IOL INSERTION 16.23 01:10.6 (Left: Eye)  Patient Location: PACU  Anesthesia Type: MAC  Level of Consciousness: awake, alert  and patient cooperative  Airway and Oxygen Therapy: Patient Spontanous Breathing   Post-op Assessment: Post-op Vital signs reviewed, Patient's Cardiovascular Status Stable, Respiratory Function Stable, Patent Airway and No signs of Nausea or vomiting  Post-op Vital Signs: Reviewed and stable  Complications: No notable events documented.

## 2024-03-05 ENCOUNTER — Other Ambulatory Visit: Payer: Self-pay | Admitting: Family Medicine
